# Patient Record
Sex: Female | Born: 1975 | Race: White | Hispanic: No | Marital: Married | Smoking: Never smoker
Health system: Southern US, Community
[De-identification: ages and names within clinical notes are randomized; demographics above are authoritative.]

## PROBLEM LIST (undated history)

## (undated) DIAGNOSIS — O09529 Supervision of elderly multigravida, unspecified trimester: Secondary | ICD-10-CM

## (undated) DIAGNOSIS — H9193 Unspecified hearing loss, bilateral: Secondary | ICD-10-CM

## (undated) DIAGNOSIS — F419 Anxiety disorder, unspecified: Secondary | ICD-10-CM

## (undated) DIAGNOSIS — N200 Calculus of kidney: Secondary | ICD-10-CM

## (undated) DIAGNOSIS — F329 Major depressive disorder, single episode, unspecified: Secondary | ICD-10-CM

## (undated) DIAGNOSIS — F32A Depression, unspecified: Secondary | ICD-10-CM

## (undated) DIAGNOSIS — D649 Anemia, unspecified: Secondary | ICD-10-CM

## (undated) DIAGNOSIS — Z8619 Personal history of other infectious and parasitic diseases: Secondary | ICD-10-CM

## (undated) DIAGNOSIS — J302 Other seasonal allergic rhinitis: Secondary | ICD-10-CM

## (undated) HISTORY — PX: WISDOM TOOTH EXTRACTION: SHX21

## (undated) HISTORY — DX: Supervision of elderly multigravida, unspecified trimester: O09.529

## (undated) HISTORY — DX: Anemia, unspecified: D64.9

## (undated) HISTORY — DX: Unspecified hearing loss, bilateral: H91.93

## (undated) HISTORY — DX: Personal history of other infectious and parasitic diseases: Z86.19

---

## 2012-06-21 ENCOUNTER — Encounter (HOSPITAL_COMMUNITY): Payer: Self-pay | Admitting: Emergency Medicine

## 2012-06-21 ENCOUNTER — Emergency Department (HOSPITAL_COMMUNITY): Payer: BC Managed Care – PPO

## 2012-06-21 ENCOUNTER — Emergency Department (HOSPITAL_COMMUNITY)
Admission: EM | Admit: 2012-06-21 | Discharge: 2012-06-21 | Disposition: A | Discharge status: Home / Self Care | Payer: BC Managed Care – PPO | Attending: Emergency Medicine | Admitting: Emergency Medicine

## 2012-06-21 DIAGNOSIS — Z3202 Encounter for pregnancy test, result negative: Secondary | ICD-10-CM | POA: Insufficient documentation

## 2012-06-21 DIAGNOSIS — R112 Nausea with vomiting, unspecified: Secondary | ICD-10-CM | POA: Insufficient documentation

## 2012-06-21 DIAGNOSIS — N83209 Unspecified ovarian cyst, unspecified side: Secondary | ICD-10-CM

## 2012-06-21 LAB — URINALYSIS, ROUTINE W REFLEX MICROSCOPIC
Bilirubin Urine: NEGATIVE
Glucose, UA: NEGATIVE mg/dL
Hgb urine dipstick: NEGATIVE
Ketones, ur: 40 mg/dL — AB
Protein, ur: NEGATIVE mg/dL
pH: 6.5 (ref 5.0–8.0)

## 2012-06-21 LAB — URINE MICROSCOPIC-ADD ON

## 2012-06-21 MED ORDER — HYDROMORPHONE HCL PF 1 MG/ML IJ SOLN
1.0000 mg | Freq: Once | INTRAMUSCULAR | Status: AC
  Administered 2012-06-21: 1 mg via INTRAVENOUS
  Filled 2012-06-21: qty 1

## 2012-06-21 MED ORDER — SODIUM CHLORIDE 0.9 % IV BOLUS (SEPSIS)
1000.0000 mL | Freq: Once | INTRAVENOUS | Status: AC
  Administered 2012-06-21: 1000 mL via INTRAVENOUS

## 2012-06-21 MED ORDER — ONDANSETRON HCL 4 MG/2ML IJ SOLN
4.0000 mg | Freq: Once | INTRAMUSCULAR | Status: AC
  Administered 2012-06-21: 4 mg via INTRAVENOUS
  Filled 2012-06-21: qty 2

## 2012-06-21 MED ORDER — HYDROMORPHONE HCL PF 2 MG/ML IJ SOLN
INTRAMUSCULAR | Status: AC
  Administered 2012-06-21: 1 mg
  Filled 2012-06-21: qty 1

## 2012-06-21 MED ORDER — HYDROCODONE-ACETAMINOPHEN 5-500 MG PO TABS: 1.0000 | ORAL_TABLET | Freq: Four times a day (QID) | ORAL | Status: DC | PRN

## 2012-06-21 MED ORDER — IBUPROFEN 600 MG PO TABS: 600.0000 mg | ORAL_TABLET | Freq: Four times a day (QID) | ORAL | Status: DC | PRN

## 2012-06-21 MED ORDER — KETOROLAC TROMETHAMINE 30 MG/ML IJ SOLN
30.0000 mg | Freq: Once | INTRAMUSCULAR | Status: AC
  Administered 2012-06-21: 30 mg via INTRAVENOUS
  Filled 2012-06-21: qty 1

## 2012-06-21 MED ORDER — ONDANSETRON HCL 4 MG PO TABS: 4.0000 mg | ORAL_TABLET | Freq: Four times a day (QID) | ORAL | Status: DC

## 2012-06-21 MED ORDER — HYDROMORPHONE HCL PF 1 MG/ML IJ SOLN: 1.0000 mg | Freq: Once | INTRAMUSCULAR | Status: AC

## 2012-06-21 NOTE — ED Notes (Signed)
Pt states she is having pain in her left flank that started about 2200  Pt states has had one episode of vomiting  Pt states pain and cramping

## 2012-06-21 NOTE — ED Provider Notes (Signed)
History     CSN: [id number]  Arrival date & time 06/21/12  0315   First MD Initiated Contact with Patient 06/21/12 0404      Chief Complaint  Patient presents with  . Flank Pain    (Consider location/radiation/quality/duration/timing/severity/associated sxs/prior treatment) HPI History provided by patient. Flank pain onset 10 PM tonight, sudden onset severe pain, does not radiate from flank. Has not noticed any hematuria. Has associated nausea with vomiting x1. No diarrhea. LMP 3 weeks ago. No trauma. No fevers. No back pain. No history of same. History reviewed. No pertinent past medical history.  History reviewed. No pertinent past surgical history.  History reviewed. No pertinent family history.  History  Substance Use Topics  . Smoking status: Never Smoker   . Smokeless tobacco: Not on file  . Alcohol Use: No    OB History    Grav Para Term Preterm Abortions TAB SAB Ect Mult Living                  Review of Systems  Constitutional: Negative for fever and chills.  HENT: Negative for neck pain and neck stiffness.   Eyes: Negative for pain.  Respiratory: Negative for shortness of breath.   Cardiovascular: Negative for chest pain.  Gastrointestinal: Positive for nausea and vomiting. Negative for abdominal pain.  Genitourinary: Positive for flank pain. Negative for dysuria.  Musculoskeletal: Negative for back pain.  Skin: Negative for rash.  Neurological: Negative for headaches.  All other systems reviewed and are negative.    Allergies  Review of patient's allergies indicates no known allergies.  Home Medications  No current outpatient prescriptions on file.  BP 129/73  Pulse 68  Temp 97.6 F (36.4 C) (Oral)  Resp 20  SpO2 100%  LMP 05/28/2012  Physical Exam  Constitutional: She is oriented to person, place, and time. She appears well-developed and well-nourished.  HENT:  Head: Normocephalic and atraumatic.  Eyes: Conjunctivae normal and EOM are  normal. Pupils are equal, round, and reactive to light.  Neck: Trachea normal. Neck supple. No thyromegaly present.  Cardiovascular: Normal rate, regular rhythm, S1 normal, S2 normal and normal pulses.     No systolic murmur is present   No diastolic murmur is present  Pulses:      Radial pulses are 2+ on the right side, and 2+ on the left side.  Pulmonary/Chest: Effort normal and breath sounds normal. She has no wheezes. She has no rhonchi. She has no rales. She exhibits no tenderness.  Abdominal: Soft. Normal appearance and bowel sounds are normal. She exhibits no distension. There is no tenderness. There is no rebound, no CVA tenderness and negative Murphy's sign.       Localizes discomfort to left flank without any reproducible tenderness.  Musculoskeletal: She exhibits no edema and no tenderness.       Moves all extremities x4  Neurological: She is alert and oriented to person, place, and time. She has normal strength. No cranial nerve deficit or sensory deficit. GCS eye subscore is 4. GCS verbal subscore is 5. GCS motor subscore is 6.  Skin: Skin is warm and dry. No rash noted. She is not diaphoretic.  Psychiatric: Her speech is normal.       Cooperative and appropriate    ED Course  Procedures (including critical care time)  Results for orders placed during the hospital encounter of 06/21/12  URINALYSIS, ROUTINE W REFLEX MICROSCOPIC      Component Value Range   Color, Urine  YELLOW  YELLOW   APPearance CLOUDY (*) CLEAR   Specific Gravity, Urine 1.023  1.005 - 1.030   pH 6.5  5.0 - 8.0   Glucose, UA NEGATIVE  NEGATIVE mg/dL   Hgb urine dipstick NEGATIVE  NEGATIVE   Bilirubin Urine NEGATIVE  NEGATIVE   Ketones, ur 40 (*) NEGATIVE mg/dL   Protein, ur NEGATIVE  NEGATIVE mg/dL   Urobilinogen, UA 1.0  0.0 - 1.0 mg/dL   Nitrite NEGATIVE  NEGATIVE   Leukocytes, UA MODERATE (*) NEGATIVE  PREGNANCY, URINE      Component Value Range   Preg Test, Ur NEGATIVE  NEGATIVE  URINE  MICROSCOPIC-ADD ON      Component Value Range   Squamous Epithelial / LPF RARE  RARE   WBC, UA 3-6  <3 WBC/hpf   Bacteria, UA FEW (*) RARE   Urine-Other MUCOUS PRESENT     Ct Abdomen Pelvis Wo Contrast  06/21/2012  *RADIOLOGY REPORT*  Clinical Data: Left flank pain, nausea and vomiting.  White blood cells in urine.  CT ABDOMEN AND PELVIS WITHOUT CONTRAST  Technique:  Multidetector CT imaging of the abdomen and pelvis was performed following the standard protocol without intravenous contrast.  Comparison: None.  Findings: Minimal bibasilar atelectasis is noted.  The liver and spleen are unremarkable in appearance.  The gallbladder is within normal limits.  The pancreas and adrenal glands are unremarkable.  Scattered tiny nonobstructing bilateral renal stones are seen, measuring up to 3 mm in size.  The kidneys are otherwise unremarkable in appearance.  There is no evidence of hydronephrosis.  No obstructing renal stones are identified.  No perinephric stranding is seen.  No free fluid is identified.  The small bowel is unremarkable in appearance.  The stomach is within normal limits.  No acute vascular abnormalities are seen.  The appendix is normal in caliber, without evidence for appendicitis.  The colon is largely decompressed and is unremarkable in appearance.  The bladder is mildly distended and grossly unremarkable in appearance.  The uterus is within normal limits.  A 5.4 cm cystic lesion is noted at the left adnexa.  The ovaries are otherwise unremarkable in appearance.  No inguinal lymphadenopathy is seen.  No acute osseous abnormalities are identified.  IMPRESSION:  1.  5.4 cm left adnexal cystic lesion noted; this could be further assessed on pelvic ultrasound, when and as deemed clinically appropriate. 2.  Scattered tiny nonobstructing bilateral renal stones seen, measuring up to 3 mm in size; no evidence of hydronephrosis.   Original Report Authenticated By: Tonia Ghent, M.D.     IV fluids.  IV Dilaudid. IV Zofran.  UA reviewed no hematuria or UTI. MDM   Left flank pain. Treated with IV narcotics. Requiring 3 doses of IV Dilaudid with intermittent pain control. CT scan obtained and reviewed, has a large ovarian cyst. Given persistent pain, ultrasound ordered to further evaluate for flow/ possible torsion.        Sunnie Nielsen, MD 06/21/12 [phone]

## 2012-06-21 NOTE — ED Provider Notes (Signed)
Report received at end of shift from Dr. Dierdre Highman.  Pt with L flank pain, treated with IV dilaudid with intermitted pain control.  CT shows large ovarian cyst.  [place] ordered to evaluate flow and r/o torsion.  Will consult OB/GYN once [place] is resulted.    BP 129/73  Pulse 68  Temp 97.6 F (36.4 C) (Oral)  Resp 20  SpO2 100%  LMP 05/28/2012  On examination she appeared in good health and spirits. Vital signs as documented. Skin warm and dry and without overt rashes. Neck without JVD. Lungs clear. Heart exam notable for regular rhythm, normal sounds and absence of murmurs, rubs or gallops. Abdomen mildly tender to LLQ without guarding or rebound tenderness and without evidence of organomegaly, masses, or abdominal aortic enlargement. No CVA tenderness.  Extremities nonedematous.  8:51 AM Pelvic and transvaginal US shows no evidence of abscess or ovarian torsion.  There is a 5x6 cm simple left adnexal cyst.  It could be a paraovarian cyst or an exophytic cyst.  Therefore, i recommend pt to f/u with GYN for close f/u as she may need f/u US following her next menstrual period.  Exophytic cyst can increase risk of ovarian torsion.  Pt is aware of plan and will f/u closely.  Resources given.  Pt able to tolerates PO, pain has improved especially after receiving toradol.    I have reviewed nursing notes and vital signs. I personally reviewed the imaging tests through PACS system  I reviewed available ER/hospitalization records thought the EMR  Results for orders placed during the hospital encounter of 06/21/12  URINALYSIS, ROUTINE W REFLEX MICROSCOPIC      Component Value Range   Color, Urine YELLOW  YELLOW   APPearance CLOUDY (*) CLEAR   Specific Gravity, Urine 1.023  1.005 - 1.030   pH 6.5  5.0 - 8.0   Glucose, UA NEGATIVE  NEGATIVE mg/dL   Hgb urine dipstick NEGATIVE  NEGATIVE   Bilirubin Urine NEGATIVE  NEGATIVE   Ketones, ur 40 (*) NEGATIVE mg/dL   Protein, ur NEGATIVE  NEGATIVE mg/dL    Urobilinogen, UA 1.0  0.0 - 1.0 mg/dL   Nitrite NEGATIVE  NEGATIVE   Leukocytes, UA MODERATE (*) NEGATIVE  PREGNANCY, URINE      Component Value Range   Preg Test, Ur NEGATIVE  NEGATIVE  URINE MICROSCOPIC-ADD ON      Component Value Range   Squamous Epithelial / LPF RARE  RARE   WBC, UA 3-6  <3 WBC/hpf   Bacteria, UA FEW (*) RARE   Urine-Other MUCOUS PRESENT     Ct Abdomen Pelvis Wo Contrast  06/21/2012  *RADIOLOGY REPORT*  Clinical Data: Left flank pain, nausea and vomiting.  White blood cells in urine.  CT ABDOMEN AND PELVIS WITHOUT CONTRAST  Technique:  Multidetector CT imaging of the abdomen and pelvis was performed following the standard protocol without intravenous contrast.  Comparison: None.  Findings: Minimal bibasilar atelectasis is noted.  The liver and spleen are unremarkable in appearance.  The gallbladder is within normal limits.  The pancreas and adrenal glands are unremarkable.  Scattered tiny nonobstructing bilateral renal stones are seen, measuring up to 3 mm in size.  The kidneys are otherwise unremarkable in appearance.  There is no evidence of hydronephrosis.  No obstructing renal stones are identified.  No perinephric stranding is seen.  No free fluid is identified.  The small bowel is unremarkable in appearance.  The stomach is within normal limits.  No acute vascular abnormalities  are seen.  The appendix is normal in caliber, without evidence for appendicitis.  The colon is largely decompressed and is unremarkable in appearance.  The bladder is mildly distended and grossly unremarkable in appearance.  The uterus is within normal limits.  A 5.4 cm cystic lesion is noted at the left adnexa.  The ovaries are otherwise unremarkable in appearance.  No inguinal lymphadenopathy is seen.  No acute osseous abnormalities are identified.  IMPRESSION:  1.  5.4 cm left adnexal cystic lesion noted; this could be further assessed on pelvic ultrasound, when and as deemed clinically appropriate.  2.  Scattered tiny nonobstructing bilateral renal stones seen, measuring up to 3 mm in size; no evidence of hydronephrosis.   Original Report Authenticated By: Tonia Ghent, M.D.    US Transvaginal Non-ob  06/21/2012  *RADIOLOGY REPORT*  Clinical Data:  Severe left-sided pain.  Evaluate ovary.  5 cm left adnexal cystic lesion seen on CT abdomen pelvis performed earlier today.  TRANSABDOMINAL AND TRANSVAGINAL ULTRASOUND OF PELVIS DOPPLER ULTRASOUND OF OVARIES  Technique:  Both transabdominal and transvaginal ultrasound examinations of the pelvis were performed. Transabdominal technique was performed for global imaging of the pelvis including uterus, ovaries, adnexal regions, and pelvic cul-de-sac.  It was necessary to proceed with endovaginal exam following the transabdominal exam to visualize the ovaries and adnexa.  Color and duplex Doppler ultrasound was utilized to evaluate blood flow to the ovaries.  Comparison:  CT abdomen pelvis 06/21/2012  Findings:  Uterus:  Normal in size and appearance.  Measures 9.6 x 4.6 x 6.6 cm and is anteverted.  No focal uterine mass identified.  Endometrium:  Measures 12 mm in thickness and is homogeneous.  Right ovary: Normal appearance.  No adnexal mass.  Measures 3.4 x 2.1 x 2.0 cm and contains multiple small follicles.  No cyst or mass identified.  Color Doppler flow is identified to the right ovary.  Left ovary:   A normal appearing left ovary is seen measuring 2.8 x 2.2 x 2.3 cm that contains small follicles and demonstrates normal vascular flow.  Immediately adjacent to the left ovary is a simple cyst measuring 5.4 x 4.1 x 5.9 cm.  Color Doppler flow is seen focally along the periphery of this cyst.  It is favored to be an exophytic left ovarian cyst.  A paraovarian cyst is also considered.  Pulsed Doppler evaluation demonstrates normal low-resistance arterial and venous waveforms in both ovaries.  A trace amount of free pelvic fluid is seen.  IMPRESSION: 1.  5.4 x 4.1 x  5.9 cm simple left adnexal cyst.  This is either an exophytic cyst extending from the adjacent left ovary or a paraovarian cyst. If this cyst is not surgically removed, short- term follow-up ultrasound following the patient's next menstrual period is suggested. If this is an exophytic cyst, it can place the patient at increased risk for ovarian torsion, although no signs of ovarian torsion are seen at this time.  2.  No sonographic evidence of ovarian torsion.   Original Report Authenticated By: Britta Mccreedy, M.D.    US Pelvis Complete  06/21/2012  *RADIOLOGY REPORT*  Clinical Data:  Severe left-sided pain.  Evaluate ovary.  5 cm left adnexal cystic lesion seen on CT abdomen pelvis performed earlier today.  TRANSABDOMINAL AND TRANSVAGINAL ULTRASOUND OF PELVIS DOPPLER ULTRASOUND OF OVARIES  Technique:  Both transabdominal and transvaginal ultrasound examinations of the pelvis were performed. Transabdominal technique was performed for global imaging of the pelvis including uterus, ovaries, adnexal  regions, and pelvic cul-de-sac.  It was necessary to proceed with endovaginal exam following the transabdominal exam to visualize the ovaries and adnexa.  Color and duplex Doppler ultrasound was utilized to evaluate blood flow to the ovaries.  Comparison:  CT abdomen pelvis 06/21/2012  Findings:  Uterus:  Normal in size and appearance.  Measures 9.6 x 4.6 x 6.6 cm and is anteverted.  No focal uterine mass identified.  Endometrium:  Measures 12 mm in thickness and is homogeneous.  Right ovary: Normal appearance.  No adnexal mass.  Measures 3.4 x 2.1 x 2.0 cm and contains multiple small follicles.  No cyst or mass identified.  Color Doppler flow is identified to the right ovary.  Left ovary:   A normal appearing left ovary is seen measuring 2.8 x 2.2 x 2.3 cm that contains small follicles and demonstrates normal vascular flow.  Immediately adjacent to the left ovary is a simple cyst measuring 5.4 x 4.1 x 5.9 cm.  Color  Doppler flow is seen focally along the periphery of this cyst.  It is favored to be an exophytic left ovarian cyst.  A paraovarian cyst is also considered.  Pulsed Doppler evaluation demonstrates normal low-resistance arterial and venous waveforms in both ovaries.  A trace amount of free pelvic fluid is seen.  IMPRESSION: 1.  5.4 x 4.1 x 5.9 cm simple left adnexal cyst.  This is either an exophytic cyst extending from the adjacent left ovary or a paraovarian cyst. If this cyst is not surgically removed, short- term follow-up ultrasound following the patient's next menstrual period is suggested. If this is an exophytic cyst, it can place the patient at increased risk for ovarian torsion, although no signs of ovarian torsion are seen at this time.  2.  No sonographic evidence of ovarian torsion.   Original Report Authenticated By: Britta Mccreedy, M.D.    [place] Art/ven Flow Abd Pelv Doppler  06/21/2012  *RADIOLOGY REPORT*  Clinical Data:  Severe left-sided pain.  Evaluate ovary.  5 cm left adnexal cystic lesion seen on CT abdomen pelvis performed earlier today.  TRANSABDOMINAL AND TRANSVAGINAL ULTRASOUND OF PELVIS DOPPLER ULTRASOUND OF OVARIES  Technique:  Both transabdominal and transvaginal ultrasound examinations of the pelvis were performed. Transabdominal technique was performed for global imaging of the pelvis including uterus, ovaries, adnexal regions, and pelvic cul-de-sac.  It was necessary to proceed with endovaginal exam following the transabdominal exam to visualize the ovaries and adnexa.  Color and duplex Doppler ultrasound was utilized to evaluate blood flow to the ovaries.  Comparison:  CT abdomen pelvis 06/21/2012  Findings:  Uterus:  Normal in size and appearance.  Measures 9.6 x 4.6 x 6.6 cm and is anteverted.  No focal uterine mass identified.  Endometrium:  Measures 12 mm in thickness and is homogeneous.  Right ovary: Normal appearance.  No adnexal mass.  Measures 3.4 x 2.1 x 2.0 cm and contains  multiple small follicles.  No cyst or mass identified.  Color Doppler flow is identified to the right ovary.  Left ovary:   A normal appearing left ovary is seen measuring 2.8 x 2.2 x 2.3 cm that contains small follicles and demonstrates normal vascular flow.  Immediately adjacent to the left ovary is a simple cyst measuring 5.4 x 4.1 x 5.9 cm.  Color Doppler flow is seen focally along the periphery of this cyst.  It is favored to be an exophytic left ovarian cyst.  A paraovarian cyst is also considered.  Pulsed Doppler evaluation  demonstrates normal low-resistance arterial and venous waveforms in both ovaries.  A trace amount of free pelvic fluid is seen.  IMPRESSION: 1.  5.4 x 4.1 x 5.9 cm simple left adnexal cyst.  This is either an exophytic cyst extending from the adjacent left ovary or a paraovarian cyst. If this cyst is not surgically removed, short- term follow-up ultrasound following the patient's next menstrual period is suggested. If this is an exophytic cyst, it can place the patient at increased risk for ovarian torsion, although no signs of ovarian torsion are seen at this time.  2.  No sonographic evidence of ovarian torsion.   Original Report Authenticated By: Britta Mccreedy, M.D.        Fayrene Helper, PA-C 06/21/12 0857  Fayrene Helper, PA-C 06/21/12 1610

## 2012-06-21 NOTE — ED Notes (Signed)
Patient transported to Ultrasound 

## 2012-06-22 ENCOUNTER — Telehealth: Payer: Self-pay | Admitting: *Deleted

## 2012-06-22 LAB — URINE CULTURE: Colony Count: 85000

## 2012-06-22 NOTE — ED Provider Notes (Signed)
Medical screening examination/treatment/procedure(s) were conducted as a shared visit with non-physician practitioner(s) and myself.  I personally evaluated the patient during the encounter  Sunnie Nielsen, MD 06/22/12 [phone]

## 2012-06-22 NOTE — Telephone Encounter (Signed)
Pt left message stating that she is a new pt and has appt later this month. She has some questions. Please call back.

## 2012-06-22 NOTE — Telephone Encounter (Signed)
Spoke with patient and she has c/o pelvic pain. She wants to know if she should/could come sooner than 12/19 appt. Advised patient that we don't have any sooner appointments available. If her pain worsens with no relief from pain med prescribed and with new symptoms such as fever or vaginal d/c--she is to come to [facility] otherwise we will expect her next week for her f/u appt. Patient agrees and satisfied.

## 2012-07-06 ENCOUNTER — Encounter: Payer: Self-pay | Admitting: Obstetrics & Gynecology

## 2012-07-06 ENCOUNTER — Ambulatory Visit (INDEPENDENT_AMBULATORY_CARE_PROVIDER_SITE_OTHER): Payer: BC Managed Care – PPO | Admitting: Obstetrics & Gynecology

## 2012-07-06 VITALS — BP 111/75 | HR 89 | Temp 97.4°F | Ht 62.0 in | Wt 160.5 lb

## 2012-07-06 DIAGNOSIS — N83209 Unspecified ovarian cyst, unspecified side: Secondary | ICD-10-CM

## 2012-07-06 DIAGNOSIS — Z23 Encounter for immunization: Secondary | ICD-10-CM

## 2012-07-06 MED ORDER — INFLUENZA VIRUS VACC SPLIT PF IM SUSP
0.5000 mL | Freq: Once | INTRAMUSCULAR | Status: AC
  Administered 2012-07-06: 0.5 mL via INTRAMUSCULAR

## 2012-07-06 NOTE — Patient Instructions (Addendum)
Pelvic Pain Pelvic pain is pain below the belly button and located between your hips. Acute pain may last a few hours or days. Chronic pelvic pain may last weeks and months. The cause may be different for different types of pain. The pain may be dull or sharp, mild or severe and can interfere with your daily activities. Write down and tell your caregiver:   Exactly where the pain is located.  If it comes and goes or is there all the time.  When it happens (with sex, urination, bowel movement, etc.)  If the pain is related to your menstrual period or stress. Your caregiver will take a full history and do a complete physical exam and Pap test. CAUSES   Painful menstrual periods (dysmenorrhea).  Normal ovulation (Mittelschmertz) that occurs in the middle of the menstrual cycle every month.  The pelvic organs get engorged with blood just before the menstrual period (pelvic congestive syndrome).  Scar tissue from an infection or past surgery (pelvic adhesions).  Cancer of the female pelvic organs. When there is pain with cancer, it has been there for a long time.  The lining of the uterus (endometrium) abnormally grows in places like the pelvis and on the pelvic organs (endometriosis).  A form of endometriosis with the lining of the uterus present inside of the muscle tissue of the uterus (adenomyosis).  Fibroid tumor (noncancerous) in the uterus.  Bladder problems such as infection, bladder spasms of the muscle tissue of the bladder.  Intestinal problems (irritable bowel syndrome, colitis, an ulcer or gastrointestinal infection).  Polyps of the cervix or uterus.  Pregnancy in the tube (ectopic pregnancy).  The opening of the cervix is too small for the menstrual blood to flow through it (cervical stenosis).  Physical or sexual abuse (past or present).  Musculo-skeletal problems from poor posture, problems with the vertebrae of the lower back or the uterine pelvic muscles falling  (prolapse).  Psychological problems such as depression or stress.  IUD (intrauterine device) in the uterus. DIAGNOSIS  Tests to make a diagnosis depends on the type, location, severity and what causes the pain to occur. Tests that may be needed include:  Blood tests.  Urine tests  Ultrasound.  X-rays.  CT Scan.  MRI.  Laparoscopy.  Major surgery. TREATMENT  Treatment will depend on the cause of the pain, which includes:  Prescription or over-the-counter pain medication.  Antibiotics.  Birth control pills.  Hormone treatment.  Nerve blocking injections.  Physical therapy.  Antidepressants.  Counseling with a psychiatrist or psychologist.  Minor or major surgery. HOME CARE INSTRUCTIONS   Only take over-the-counter or prescription medicines for pain, discomfort or fever as directed by your caregiver.  Follow your caregiver's advice to treat your pain.  Rest.  Avoid sexual intercourse if it causes the pain.  Apply warm or cold compresses (which ever works best) to the pain area.  Do relaxation exercises such as yoga or meditation.  Try acupuncture.  Avoid stressful situations.  Try group therapy.  If the pain is because of a stomach/intestinal upset, drink clear liquids, eat a bland light food diet until the symptoms go away. SEEK MEDICAL CARE IF:   You need stronger prescription pain medication.  You develop pain with sexual intercourse.  You have pain with urination.  You develop a temperature of 102 F (38.9 C) with the pain.  You are still in pain after 4 hours of taking prescription medication for the pain.  You need depression medication.    Your IUD is causing pain and you want it removed. SEEK IMMEDIATE MEDICAL CARE IF:  You develop very severe pain or tenderness.  You faint, have chills, severe weakness or dehydration.  You develop heavy vaginal bleeding or passing solid tissue.  You develop a temperature of 102 F (38.9 C)  with the pain.  You have blood in the urine.  You are being physically or sexually abused.  You have uncontrolled vomiting and diarrhea.  You are depressed and afraid of harming yourself or someone else. Document Released: 08/12/2004 Document Revised: 09/27/2011 Document Reviewed: 05/09/2008 [facility] Patient Information 2013 [place], [place]. Ovarian Cyst The ovaries are small organs that are on each side of the uterus. The ovaries are the organs that produce the female hormones, estrogen and progesterone. An ovarian cyst is a sac filled with fluid that can vary in its size. It is normal for a small cyst to form in women who are in the childbearing age and who have menstrual periods. This type of cyst is called a follicle cyst that becomes an ovulation cyst (corpus luteum cyst) after it produces the women's egg. It later goes away on its own if the woman does not become pregnant. There are other kinds of ovarian cysts that may cause problems and may need to be treated. The most serious problem is a cyst with cancer. It should be noted that menopausal women who have an ovarian cyst are at a higher risk of it being a cancer cyst. They should be evaluated very quickly, thoroughly and followed closely. This is especially true in menopausal women because of the high rate of ovarian cancer in women in menopause. CAUSES AND TYPES OF OVARIAN CYSTS:  FUNCTIONAL CYST: The follicle/corpus luteum cyst is a functional cyst that occurs every month during ovulation with the menstrual cycle. They go away with the next menstrual cycle if the woman does not get pregnant. Usually, there are no symptoms with a functional cyst.  ENDOMETRIOMA CYST: This cyst develops from the lining of the uterus tissue. This cyst gets in or on the ovary. It grows every month from the bleeding during the menstrual period. It is also called a "chocolate cyst" because it becomes filled with blood that turns brown. This cyst can cause pain  in the lower abdomen during intercourse and with your menstrual period.  CYSTADENOMA CYST: This cyst develops from the cells on the outside of the ovary. They usually are not cancerous. They can get very big and cause lower abdomen pain and pain with intercourse. This type of cyst can twist on itself, cut off its blood supply and cause severe pain. It also can easily rupture and cause a lot of pain.  DERMOID CYST: This type of cyst is sometimes found in both ovaries. They are found to have different kinds of body tissue in the cyst. The tissue includes skin, teeth, hair, and/or cartilage. They usually do not have symptoms unless they get very big. Dermoid cysts are rarely cancerous.  POLYCYSTIC OVARY: This is a rare condition with hormone problems that produces many small cysts on both ovaries. The cysts are follicle-like cysts that never produce an egg and become a corpus luteum. It can cause an increase in body weight, infertility, acne, increase in body and facial hair and lack of menstrual periods or rare menstrual periods. Many women with this problem develop type 2 diabetes. The exact cause of this problem is unknown. A polycystic ovary is rarely cancerous.  THECA LUTEIN CYST: Occurs  when too much hormone (human chorionic gonadotropin) is produced and over-stimulates the ovaries to produce an egg. They are frequently seen when doctors stimulate the ovaries for invitro-fertilization (test tube babies).  LUTEOMA CYST: This cyst is seen during pregnancy. Rarely it can cause an obstruction to the birth canal during labor and delivery. They usually go away after delivery. SYMPTOMS   Pelvic pain or pressure.  Pain during sexual intercourse.  Increasing girth (swelling) of the abdomen.  Abnormal menstrual periods.  Increasing pain with menstrual periods.  You stop having menstrual periods and you are not pregnant. DIAGNOSIS  The diagnosis can be made during:  Routine or annual pelvic  examination (common).  Ultrasound.  X-ray of the pelvis.  CT Scan.  MRI.  Blood tests. TREATMENT   Treatment may only be to follow the cyst monthly for 2 to 3 months with your caregiver. Many go away on their own, especially functional cysts.  May be aspirated (drained) with a long needle with ultrasound, or by laparoscopy (inserting a tube into the pelvis through a small incision).  The whole cyst can be removed by laparoscopy.  Sometimes the cyst may need to be removed through an incision in the lower abdomen.  Hormone treatment is sometimes used to help dissolve certain cysts.  Birth control pills are sometimes used to help dissolve certain cysts. HOME CARE INSTRUCTIONS  Follow your caregiver's advice regarding:  Medicine.  Follow up visits to evaluate and treat the cyst.  You may need to come back or make an appointment with another caregiver, to find the exact cause of your cyst, if your caregiver is not a gynecologist.  Get your yearly and recommended pelvic examinations and Pap tests.  Let your caregiver know if you have had an ovarian cyst in the past. SEEK MEDICAL CARE IF:   Your periods are late, irregular, they stop, or are painful.  Your stomach (abdomen) or pelvic pain does not go away.  Your stomach becomes larger or swollen.  You have pressure on your bladder or trouble emptying your bladder completely.  You have painful sexual intercourse.  You have feelings of fullness, pressure, or discomfort in your stomach.  You lose weight for no apparent reason.  You feel generally ill.  You become constipated.  You lose your appetite.  You develop acne.  You have an increase in body and facial hair.  You are gaining weight, without changing your exercise and eating habits.  You think you are pregnant. SEEK IMMEDIATE MEDICAL CARE IF:   You have increasing abdominal pain.  You feel sick to your stomach (nausea) and/or vomit.  You develop a  fever that comes on suddenly.  You develop abdominal pain during a bowel movement.  Your menstrual periods become heavier than usual. Document Released: 07/05/2005 Document Revised: 09/27/2011 Document Reviewed: 05/08/2009 [facility] Patient Information 2013 [place], [place].

## 2012-07-06 NOTE — Progress Notes (Addendum)
Subjective:     Patient ID: Karen Monroe, female   DOB: 04-17-1976, 36 y.o.   MRN: [id number]  HPI Karen Monroe is a 36 y.o. female who presents today for follow-up from the ED where Karen Monroe was diagnosed with an ovarian cyst. The patient went to the ED 2 weeks ago for left flank pain. At that time the pain had been so severe that Karen Monroe had vomited. The pain has improved significantly. Karen Monroe rates her pain today at 1-2/10 and describes it as more of a fullness than pain. Karen Monroe has also been experiencing low back pain, bloating and constipation over the last week. Karen Monroe called the ED about her constipation and was told that it could be a result of the Vicodin that they gave her for pain. Karen Monroe was told to start colace to help with her symptoms and has felt some improvement.   The patient also would like advice about her hemorrhoids. Karen Monroe has had issues with hemorrhoids in the past especially following the birth of her son. Karen Monroe has had injections for internal hemorrhoids in the past that helped a lot. Karen Monroe has had bleeding associated with her bowel movements this past week and would like to know when further evaluation would be recommended. Karen Monroe moved here from [place] within the last year or two and has not yet established care with a PCP.   Review of Systems All negative unless otherwise stated in HPI    Objective:   Physical Exam BP 111/75  Pulse 89  Temp 97.4 F (36.3 C) (Oral)  Ht 5\' 2"  (1.575 m)  Wt 160 lb 8 oz (72.802 kg)  BMI 29.36 kg/m2  LMP 05/28/2012 GENERAL: Well-developed, well-nourished female in no acute distress.  HEENT: Normocephalic, atraumatic. Sclerae anicteric.  LUNGS: Normal effort.  HEART: Regular rate. ABDOMEN: Soft, mild tenderness in the LLQ, nondistended. No organomegaly. Bowel sounds present in all quadrants.  BIMANUAL: Normal external female genitalia.Uterus is normal in size. No adnexal mass, mild tenderness of the left adnexa.  EXTREMITIES: No cyanosis, clubbing, or  edema     Assessment:     1. Ovarian cyst     Plan:     Flu shot given today Follow-up US scheduled in 3 months. Patient will then follow-up in clinic to discuss results Contact information given for [facility]. Patient will call to establish care with both offices Recommended Miralax daily for improved digestive regularity Patient encouraged to call office with any questions or concerns prior to her scheduled follow-up     Attestation of Attending Supervision of Advanced Practitioner (CNM/NP): Evaluation and management procedures were performed by the Advanced Practitioner under my supervision and collaboration.  I have reviewed the Advanced Practitioner's note and chart, and I agree with the management and plan.  HARRAWAY-SMITH, CAROLYN 9:36 AM

## 2012-07-14 ENCOUNTER — Other Ambulatory Visit (HOSPITAL_COMMUNITY): Payer: BC Managed Care – PPO

## 2012-07-19 NOTE — L&D Delivery Note (Signed)
Delivery Note At 4:36 PM a viable female was delivered via  (Presentation DOA: ;  ).  APGAR per nursing notes: , ; weight .  pending Placenta status: , .  Cord:  with the following complications: .  Cord pH: not indicated  Anesthesia: Epidural  Episiotomy: none Lacerations: 2nd degree perineal Suture Repair: 3.0 vicryl rapide Est. Blood Loss (mL): 350cc  Mom to postpartum.  Baby to nursery-stable.  [name] 02/26/2013, 4:47 PM

## 2012-08-09 LAB — OB RESULTS CONSOLE HEPATITIS B SURFACE ANTIGEN: Hepatitis B Surface Ag: NEGATIVE

## 2012-08-09 LAB — OB RESULTS CONSOLE GC/CHLAMYDIA: Chlamydia: NEGATIVE

## 2012-08-09 LAB — OB RESULTS CONSOLE HIV ANTIBODY (ROUTINE TESTING): HIV: NONREACTIVE

## 2012-08-09 LAB — OB RESULTS CONSOLE RPR: RPR: NONREACTIVE

## 2012-10-04 ENCOUNTER — Ambulatory Visit (HOSPITAL_COMMUNITY): Payer: BC Managed Care – PPO

## 2013-02-23 ENCOUNTER — Encounter (HOSPITAL_COMMUNITY): Payer: Self-pay | Admitting: *Deleted

## 2013-02-23 ENCOUNTER — Telehealth (HOSPITAL_COMMUNITY): Payer: Self-pay | Admitting: *Deleted

## 2013-02-23 ENCOUNTER — Other Ambulatory Visit: Payer: Self-pay | Admitting: Obstetrics

## 2013-02-23 NOTE — Telephone Encounter (Signed)
Preadmission screen  

## 2013-02-26 ENCOUNTER — Inpatient Hospital Stay (HOSPITAL_COMMUNITY): Payer: BC Managed Care – PPO | Admitting: Anesthesiology

## 2013-02-26 ENCOUNTER — Encounter (HOSPITAL_COMMUNITY): Payer: Self-pay

## 2013-02-26 ENCOUNTER — Encounter (HOSPITAL_COMMUNITY): Payer: Self-pay | Admitting: Anesthesiology

## 2013-02-26 ENCOUNTER — Inpatient Hospital Stay (HOSPITAL_COMMUNITY)
Admission: RE | Admit: 2013-02-26 | Discharge: 2013-02-28 | DRG: 373 | Disposition: A | Discharge status: Home / Self Care | Payer: BC Managed Care – PPO | Source: Ambulatory Visit | Attending: Obstetrics | Admitting: Obstetrics

## 2013-02-26 DIAGNOSIS — O9903 Anemia complicating the puerperium: Secondary | ICD-10-CM | POA: Diagnosis not present

## 2013-02-26 DIAGNOSIS — O34599 Maternal care for other abnormalities of gravid uterus, unspecified trimester: Secondary | ICD-10-CM | POA: Diagnosis not present

## 2013-02-26 DIAGNOSIS — O09529 Supervision of elderly multigravida, unspecified trimester: Secondary | ICD-10-CM | POA: Diagnosis present

## 2013-02-26 DIAGNOSIS — N83209 Unspecified ovarian cyst, unspecified side: Secondary | ICD-10-CM | POA: Diagnosis present

## 2013-02-26 DIAGNOSIS — D62 Acute posthemorrhagic anemia: Secondary | ICD-10-CM | POA: Diagnosis not present

## 2013-02-26 LAB — CBC
HCT: 30.2 % — ABNORMAL LOW (ref 36.0–46.0)
Hemoglobin: 10.5 g/dL — ABNORMAL LOW (ref 12.0–15.0)
MCHC: 34.8 g/dL (ref 30.0–36.0)
MCV: 87.3 fL (ref 78.0–100.0)
RDW: 13.8 % (ref 11.5–15.5)

## 2013-02-26 MED ORDER — FENTANYL 2.5 MCG/ML BUPIVACAINE 1/10 % EPIDURAL INFUSION (WH - ANES)
14.0000 mL/h | INTRAMUSCULAR | Status: DC | PRN
  Administered 2013-02-26 (×2): 14 mL/h via EPIDURAL
  Filled 2013-02-26: qty 125

## 2013-02-26 MED ORDER — DIPHENHYDRAMINE HCL 50 MG/ML IJ SOLN: 12.5000 mg | INTRAMUSCULAR | Status: DC | PRN

## 2013-02-26 MED ORDER — LANOLIN HYDROUS EX OINT: TOPICAL_OINTMENT | CUTANEOUS | Status: DC | PRN

## 2013-02-26 MED ORDER — LACTATED RINGERS IV SOLN: 500.0000 mL | INTRAVENOUS | Status: DC | PRN

## 2013-02-26 MED ORDER — BENZOCAINE-MENTHOL 20-0.5 % EX AERO
1.0000 "application " | INHALATION_SPRAY | CUTANEOUS | Status: DC | PRN
  Filled 2013-02-26: qty 56

## 2013-02-26 MED ORDER — EPHEDRINE 5 MG/ML INJ
10.0000 mg | INTRAVENOUS | Status: DC | PRN
  Filled 2013-02-26: qty 2

## 2013-02-26 MED ORDER — FLEET ENEMA 7-19 GM/118ML RE ENEM: 1.0000 | ENEMA | Freq: Every day | RECTAL | Status: DC | PRN

## 2013-02-26 MED ORDER — DIBUCAINE 1 % RE OINT: 1.0000 "application " | TOPICAL_OINTMENT | RECTAL | Status: DC | PRN

## 2013-02-26 MED ORDER — IBUPROFEN 600 MG PO TABS
600.0000 mg | ORAL_TABLET | Freq: Four times a day (QID) | ORAL | Status: DC
  Administered 2013-02-26 – 2013-02-28 (×7): 600 mg via ORAL
  Filled 2013-02-26 (×7): qty 1

## 2013-02-26 MED ORDER — TETANUS-DIPHTH-ACELL PERTUSSIS 5-2.5-18.5 LF-MCG/0.5 IM SUSP: 0.5000 mL | Freq: Once | INTRAMUSCULAR | Status: DC

## 2013-02-26 MED ORDER — BISACODYL 10 MG RE SUPP: 10.0000 mg | Freq: Every day | RECTAL | Status: DC | PRN

## 2013-02-26 MED ORDER — PHENYLEPHRINE 40 MCG/ML (10ML) SYRINGE FOR IV PUSH (FOR BLOOD PRESSURE SUPPORT)
80.0000 ug | PREFILLED_SYRINGE | INTRAVENOUS | Status: DC | PRN
  Filled 2013-02-26: qty 2

## 2013-02-26 MED ORDER — ONDANSETRON HCL 4 MG/2ML IJ SOLN: 4.0000 mg | INTRAMUSCULAR | Status: DC | PRN

## 2013-02-26 MED ORDER — LIDOCAINE HCL (PF) 1 % IJ SOLN
30.0000 mL | INTRAMUSCULAR | Status: DC | PRN
  Filled 2013-02-26 (×2): qty 30

## 2013-02-26 MED ORDER — LIDOCAINE HCL (PF) 1 % IJ SOLN
INTRAMUSCULAR | Status: DC | PRN
  Administered 2013-02-26 (×4): 4 mL

## 2013-02-26 MED ORDER — SIMETHICONE 80 MG PO CHEW: 80.0000 mg | CHEWABLE_TABLET | ORAL | Status: DC | PRN

## 2013-02-26 MED ORDER — BUPIVACAINE HCL (PF) 0.25 % IJ SOLN
INTRAMUSCULAR | Status: DC | PRN
  Administered 2013-02-26 (×2): 5 mL via EPIDURAL

## 2013-02-26 MED ORDER — ONDANSETRON HCL 4 MG PO TABS: 4.0000 mg | ORAL_TABLET | ORAL | Status: DC | PRN

## 2013-02-26 MED ORDER — LACTATED RINGERS IV SOLN
INTRAVENOUS | Status: DC
  Administered 2013-02-26 (×2): via INTRAVENOUS

## 2013-02-26 MED ORDER — OXYTOCIN BOLUS FROM INFUSION
500.0000 mL | INTRAVENOUS | Status: DC
  Administered 2013-02-26: 500 mL via INTRAVENOUS

## 2013-02-26 MED ORDER — FLEET ENEMA 7-19 GM/118ML RE ENEM: 1.0000 | ENEMA | RECTAL | Status: DC | PRN

## 2013-02-26 MED ORDER — WITCH HAZEL-GLYCERIN EX PADS: 1.0000 "application " | MEDICATED_PAD | CUTANEOUS | Status: DC | PRN

## 2013-02-26 MED ORDER — EPHEDRINE 5 MG/ML INJ
10.0000 mg | INTRAVENOUS | Status: DC | PRN
  Filled 2013-02-26: qty 4
  Filled 2013-02-26: qty 2

## 2013-02-26 MED ORDER — PRENATAL MULTIVITAMIN CH
1.0000 | ORAL_TABLET | Freq: Every day | ORAL | Status: DC
  Administered 2013-02-27: 1 via ORAL
  Filled 2013-02-26: qty 1

## 2013-02-26 MED ORDER — ONDANSETRON HCL 4 MG/2ML IJ SOLN: 4.0000 mg | Freq: Four times a day (QID) | INTRAMUSCULAR | Status: DC | PRN

## 2013-02-26 MED ORDER — DIPHENHYDRAMINE HCL 25 MG PO CAPS: 25.0000 mg | ORAL_CAPSULE | Freq: Four times a day (QID) | ORAL | Status: DC | PRN

## 2013-02-26 MED ORDER — PHENYLEPHRINE 40 MCG/ML (10ML) SYRINGE FOR IV PUSH (FOR BLOOD PRESSURE SUPPORT)
80.0000 ug | PREFILLED_SYRINGE | INTRAVENOUS | Status: DC | PRN
  Filled 2013-02-26: qty 5
  Filled 2013-02-26: qty 2

## 2013-02-26 MED ORDER — ACETAMINOPHEN 325 MG PO TABS: 650.0000 mg | ORAL_TABLET | ORAL | Status: DC | PRN

## 2013-02-26 MED ORDER — OXYCODONE-ACETAMINOPHEN 5-325 MG PO TABS
1.0000 | ORAL_TABLET | ORAL | Status: DC | PRN
  Administered 2013-02-26 – 2013-02-27 (×3): 1 via ORAL
  Filled 2013-02-26 (×3): qty 1

## 2013-02-26 MED ORDER — IBUPROFEN 600 MG PO TABS: 600.0000 mg | ORAL_TABLET | Freq: Four times a day (QID) | ORAL | Status: DC | PRN

## 2013-02-26 MED ORDER — ZOLPIDEM TARTRATE 5 MG PO TABS: 5.0000 mg | ORAL_TABLET | Freq: Every evening | ORAL | Status: DC | PRN

## 2013-02-26 MED ORDER — SENNOSIDES-DOCUSATE SODIUM 8.6-50 MG PO TABS
2.0000 | ORAL_TABLET | Freq: Every day | ORAL | Status: DC
  Administered 2013-02-26 – 2013-02-27 (×2): 2 via ORAL

## 2013-02-26 MED ORDER — LACTATED RINGERS IV SOLN: 500.0000 mL | Freq: Once | INTRAVENOUS | Status: DC

## 2013-02-26 MED ORDER — OXYTOCIN 40 UNITS IN LACTATED RINGERS INFUSION - SIMPLE MED
62.5000 mL/h | INTRAVENOUS | Status: DC
  Filled 2013-02-26: qty 1000

## 2013-02-26 MED ORDER — OXYCODONE-ACETAMINOPHEN 5-325 MG PO TABS: 1.0000 | ORAL_TABLET | ORAL | Status: DC | PRN

## 2013-02-26 MED ORDER — CITRIC ACID-SODIUM CITRATE 334-500 MG/5ML PO SOLN: 30.0000 mL | ORAL | Status: DC | PRN

## 2013-02-26 NOTE — Anesthesia Procedure Notes (Signed)
Epidural Patient location during procedure: OB Start time: 02/26/2013 11:13 AM  Staffing Performed by: anesthesiologist   Preanesthetic Checklist Completed: patient identified, site marked, surgical consent, pre-op evaluation, timeout performed, IV checked, risks and benefits discussed and monitors and equipment checked  Epidural Patient position: sitting Prep: site prepped and draped and DuraPrep Patient monitoring: continuous pulse ox and blood pressure Approach: midline Injection technique: LOR air  Needle:  Needle type: Tuohy  Needle gauge: 17 G Needle length: 9 cm and 9 Needle insertion depth: 6 cm Catheter type: closed end flexible Catheter size: 19 Gauge Catheter at skin depth: 11 cm Test dose: negative  Assessment Events: blood not aspirated, injection not painful, no injection resistance, negative IV test and no paresthesia  Additional Notes Discussed risk of headache, infection, bleeding, nerve injury and failed or incomplete block.  Patient voices understanding and wishes to proceed.  Epidural placed easily on first attempt.  No paresthesia.  Patient tolerated procedure well with no apparent complications.  Jasmine December, MDReason for block:procedure for pain

## 2013-02-26 NOTE — Progress Notes (Signed)
Subjective:  G4P2A1 39 wks induction of labor Doing well, pain none, UCs mild, irregular  Anesthesia none   Objective: BP 117/77  Pulse 102  Temp(Src) 97.8 F (36.6 C) (Oral)  Resp 20  Ht 5\' 2"  (1.575 m)  Wt 83.915 kg (185 lb)  BMI 33.83 kg/m2  LMP 06/05/2012   FHT:  FHR: 140 bpm, variability: moderate,  accelerations:  Present,  decelerations:  Absent UC:   irregular, every 3-5 minutes VE:   Dilation: 3 Effacement (%): 60 Station: -3 Exam by:: Dr. Seymour Bars AROM AF clear ++  Assessment / Plan: 39 wks Multiparous with previous IOL x 2.  Favorable cervix. AROM, will add Pitocin as needed.   Fetal Wellbeing:  Category I Pain Control:  Epidural PRN in labor  Anticipated MOD:  NSVD  Benjamine Strout,MARIE-LYNE 02/26/2013, 9:32 AM

## 2013-02-26 NOTE — Progress Notes (Signed)
S: Doing well, no complaints, pain well controlled with epidural  O: BP 101/72  Pulse 104  Temp(Src) 98.1 F (36.7 C) (Oral)  Resp 20  Ht 5\' 2"  (1.575 m)  Wt 83.915 kg (185 lb)  BMI 33.83 kg/m2  SpO2 100%  LMP 06/05/2012   FHT:  FHR: 125s bpm, variability: moderate,  accelerations:  Present,  decelerations:  Absent UC:   regular, every 3 minutes, contracting on own after AROM a him in SVE:   Dilation: 5.5 Effacement (%): 70 Station: -2 Exam by:: Raliegh Ip RN   A / P:  37 y.o.  Obstetric History   G4   P2   T2   P0   A1   TAB0   SAB1   E0   M0   L2    at [redacted]w[redacted]d Induction of labor due to elective, term, favorable cervix,  progressing well on pitocin  Fetal Wellbeing:  Category I Pain Control:  Epidural  Anticipated MOD:  NSVD  Karen Monroe A. 02/26/2013, 1:27 PM

## 2013-02-26 NOTE — Anesthesia Preprocedure Evaluation (Signed)
Anesthesia Evaluation  Patient identified by MRN, date of birth, ID band Patient awake    Reviewed: Allergy & Precautions, H&P , NPO status , Patient's Chart, lab work & pertinent test results, reviewed documented beta blocker date and time   History of Anesthesia Complications Negative for: history of anesthetic complications  Airway Mallampati: I TM Distance: >3 FB Neck ROM: full    Dental  (+) Teeth Intact   Pulmonary neg pulmonary ROS,  breath sounds clear to auscultation        Cardiovascular negative cardio ROS  Rhythm:regular Rate:Normal     Neuro/Psych negative neurological ROS  negative psych ROS   GI/Hepatic negative GI ROS, Neg liver ROS,   Endo/Other  negative endocrine ROS  Renal/GU negative Renal ROS     Musculoskeletal   Abdominal   Peds  Hematology  (+) anemia ,   Anesthesia Other Findings   Reproductive/Obstetrics (+) Pregnancy                           Anesthesia Physical Anesthesia Plan  ASA: II  Anesthesia Plan: Epidural   Post-op Pain Management:    Induction:   Airway Management Planned:   Additional Equipment:   Intra-op Plan:   Post-operative Plan:   Informed Consent: I have reviewed the patients History and Physical, chart, labs and discussed the procedure including the risks, benefits and alternatives for the proposed anesthesia with the patient or authorized representative who has indicated his/her understanding and acceptance.     Plan Discussed with:   Anesthesia Plan Comments:         Anesthesia Quick Evaluation  

## 2013-02-26 NOTE — H&P (Signed)
Karen Monroe is a 37 y.o. [id number] at [redacted]w[redacted]d presenting for elective IOL at term with ripe cervix. Pt notes occasional contractions. Good fetal movement, No vaginal bleeding, not leaking fluid.  P[facility] at [facility] since 9 wks - dated by 9 wk u/s, unsure LMP - AMA, nl NT, nl AFP - early radiation exposure- pelvic CT at 3 wks - 5 cm simple L ovarian cyst, non-vascular, stable through pregnancy - Anemia, on iron - failed 1 hr DS, nl 3 hr, 20# wt gain in preg   Prenatal Transfer Tool  Maternal Diabetes: No Genetic Screening: Normal Maternal Ultrasounds/Referrals: Normal Fetal Ultrasounds or other Referrals:  None Maternal Substance Abuse:  No Significant Maternal Medications:  None Significant Maternal Lab Results: None     OB History   Grav Para Term Preterm Abortions TAB SAB Ect Mult Living   4 2 2  1  1   2      Past Medical History  Diagnosis Date  . AMA (advanced maternal age) multigravida 35+   . Anemia   . Hx of varicella   . 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 No past surgeries     Family History: family history is negative for Arthritis, and Alcohol abuse, and Asthma, and Birth defects, and Cancer, and COPD, and Depression, and Diabetes, and Drug abuse, and Early death, and Hearing loss, and Heart disease, and Hyperlipidemia, and Hypertension, and Kidney disease, and Learning disabilities, and Mental illness, and Mental retardation, and Miscarriages / Stillbirths, and Stroke, and Vision loss, . Social History:  reports that she has never smoked. She has never used smokeless tobacco. She reports that she does not drink alcohol or use illicit drugs.  Review of Systems - Negative except pregnancy   Dilation: 10 Effacement (%): 100 Station: +2 Exam by:: Susie Nix RN Blood pressure 124/63, pulse 113, temperature 98.8 F (37.1 C), temperature source Oral, resp. rate 20, height 5\' 2"  (1.575 m), weight  83.915 kg (185 lb), last menstrual period 06/05/2012, SpO2 97.00%.  Physical Exam:  Gen: well appearing, no distress CV: RRR Pulm: CTAB Back: no CVAT Abd: gravid, NT, no RUQ pain LE: trace edema, equal bilaterally, non-tender   Prenatal labs: ABO, Rh: A/Positive/-- (01/22 0000) Antibody: Negative (01/22 0000) Rubella:  immune RPR: NON REACTIVE (08/11 0727)  HBsAg: Negative (01/22 0000)  HIV: Non-reactive (01/22 0000)  GBS: Negative (07/15 0000)  1 hr Glucola 158, nl 3 hr  Genetic screening nl AFP, nl NT Anatomy US nl anatomy   Assessment/Plan: 37 y.o. [id number] at [redacted]w[redacted]d - elective IOL at term with ripe cvx, plan AROM, pitocin if needed - Reactive fetal testing

## 2013-02-27 LAB — CBC
Hemoglobin: 9.5 g/dL — ABNORMAL LOW (ref 12.0–15.0)
Platelets: 206 10*3/uL (ref 150–400)
RBC: 3.16 MIL/uL — ABNORMAL LOW (ref 3.87–5.11)
WBC: 9.5 10*3/uL (ref 4.0–10.5)

## 2013-02-27 MED ORDER — POLYSACCHARIDE IRON COMPLEX 150 MG PO CAPS
150.0000 mg | ORAL_CAPSULE | Freq: Every day | ORAL | Status: DC
  Administered 2013-02-27: 150 mg via ORAL
  Filled 2013-02-27 (×2): qty 1

## 2013-02-27 NOTE — Progress Notes (Signed)
Patient ID: Karen Monroe, female   DOB: 04-Nov-1975, 37 y.o.   MRN: [id number] PPD # 1 SVD  S:  Reports feeling well             Tolerating po/ No nausea or vomiting             Bleeding is light             Pain controlled with ibuprofen (OTC)             Up ad lib / ambulatory / voiding without difficulties    Newborn  Information for the patient's newborn:  [name], [name] [[id number]]  Female; breast feeding   O:  A & O x 3, in no apparent distress, very pleasant             VS:  Filed Vitals:   02/26/13 1930 02/26/13 2357 02/27/13 0540 02/27/13 0800  BP: 119/79 112/72 113/81 108/70  Pulse: 99 92 92 96  Temp: 98.3 F (36.8 C) 97.6 F (36.4 C) 98.3 F (36.8 C) 97.9 F (36.6 C)  TempSrc: Oral Axillary Oral Oral  Resp: 18 18 18 18   Height:      Weight:      SpO2:  98%  96%    LABS:  Recent Labs  02/26/13 0727 02/27/13 0625  WBC 6.3 9.5  HGB 10.5* 9.5*  HCT 30.2* 27.8*  PLT 243 206    Blood type: A/Positive/-- (01/22 0000)  Rubella: Immune (01/22 0000)    Lungs: Clear and unlabored  Heart: regular rate and rhythm / no murmurs  Abdomen: soft, non-tender, non-distended, normal bowel sounds             Fundus: firm, non-tender, U-1  Perineum: 2nd degree repair healing well, no edema  Lochia: light  Extremities: no edema, no calf pain or tenderness, no Homans    A/P: PPD # 1  37 y.o., [id number]   Principal Problem:    Postpartum care following vaginal delivery (8/11)    Doing well - stable status  Routine post partum orders  Anticipate discharge tomorrow   Raelyn Mora, M, MSN, CNM 02/27/2013, 3:21 PM

## 2013-02-27 NOTE — Anesthesia Postprocedure Evaluation (Signed)
  Anesthesia Post-op Note  Anesthesia Post Note  Patient: Karen Monroe  Procedure(s) Performed: * No procedures listed *  Anesthesia type: Epidural  Patient location: Mother/Baby  Post pain: Pain level controlled  Post assessment: Post-op Vital signs reviewed  Last Vitals:  Filed Vitals:   02/27/13 0540  BP: 113/81  Pulse: 92  Temp: 36.8 C  Resp: 18    Post vital signs: Reviewed  Level of consciousness:alert  Complications: No apparent anesthesia complications

## 2013-02-28 MED ORDER — IBUPROFEN 600 MG PO TABS: 600.0000 mg | ORAL_TABLET | Freq: Four times a day (QID) | ORAL | Status: DC

## 2013-02-28 MED ORDER — POLYSACCHARIDE IRON COMPLEX 150 MG PO CAPS: 150.0000 mg | ORAL_CAPSULE | Freq: Every day | ORAL | Status: DC

## 2013-02-28 NOTE — Lactation Note (Signed)
This note was copied from the chart of Karen Zelena Bushong. Lactation Consultation Note  Patient Name: Karen Monroe [id number] Date: 02/28/2013 Reason for consult: Follow-up assessment   Maternal Data    Feeding    LATCH Score/Interventions                      Lactation Tools Discussed/Used     Consult Status Consult Status: Complete  Experienced BF mom reports that baby was more fussy through the night. Now is asleep in mom's arms- she reports that baby last fed about 2 hours ago but has just gotten sound asleep. No questions at present. To call prn.  Pamelia Hoit 02/28/2013, 8:40 AM

## 2013-02-28 NOTE — Progress Notes (Addendum)
PPD #2- SVD  Subjective:   Reports feeling good Tolerating po/ No nausea or vomiting Bleeding is light Pain controlled with Motrin Up ad lib / ambulatory / voiding without problems Newborn: breastfeeding    Objective:   VS: Temp:  [98 F (36.7 C)-98.2 F (36.8 C)] 98 F (36.7 C) (08/13 0535) Pulse Rate:  [84-88] 88 (08/13 0535) Resp:  [20] 20 (08/13 0535) BP: (103-105)/(71-72) 103/72 mmHg (08/13 0535)  LABS:  Recent Labs  02/26/13 0727 02/27/13 0625  WBC 6.3 9.5  HGB 10.5* 9.5*  PLT 243 206   Blood type: A/Positive/-- (01/22 0000) Rubella: Immune (01/22 0000)                I&O: Intake/Output     08/12 0701 - 08/13 0700 08/13 0701 - 08/14 0700   Blood     Total Output       Net              Physical Exam: Alert and oriented X3 Lungs: Clear and unlabored Heart: regular rate and rhythm / no mumurs Abdomen: soft, non-tender, non-distended  Fundus: firm, non-tender, U-2 Perineum: Well approximated, no significant erythema, edema, or drainage; healing well. Lochia: small Extremities: negative edema, no calf pain or tenderness    Assessment: PPD # 2 [id number]/ S/P:induced vaginal ABL anemia-stable Doing well - stable for discharge home   Plan: Discharge home RX's:  Ibuprofen 600mg  po Q 6 hrs prn pain #30 Refill x 0 Niferex 150mg  po QD/BID #30/#60 Refill x 0 Colace 100mg  po BID Routine pp visit in 6wks WOB/GYN booklet given    Donette Larry, N MSN, CNM 02/28/2013, 8:29 AM

## 2013-02-28 NOTE — Discharge Summary (Signed)
Obstetric Discharge Summary Reason for Admission: induction of labor Prenatal Procedures: none Intrapartum Procedures: spontaneous vaginal delivery Postpartum Procedures: none Complications-Operative and Postpartum: 2nd degree perineal laceration Hemoglobin  Date Value Range Status  02/27/2013 9.5* 12.0 - 15.0 g/dL Final     HCT  Date Value Range Status  02/27/2013 27.8* 36.0 - 46.0 % Final    Physical Exam:  General: alert and cooperative Lochia: appropriate Uterine Fundus: firm Incision: healing well, no significant drainage, no dehiscence, no significant erythema DVT Evaluation: No evidence of DVT seen on physical exam. Negative Homan's sign.  Discharge Diagnoses: Term Pregnancy-delivered  Discharge Information: Date: 02/28/2013 Activity: pelvic rest Diet: routine Medications: PNV, Ibuprofen, Colace and Iron Condition: stable Instructions: refer to practice specific booklet Discharge to: home Follow-up Information   Follow up with [facility] A[facility] MD. Schedule an appointment as soon as possible for a visit in 6 weeks.   Specialty:  Obstetrics and Gynecology   Contact information:   [address] [place] [address] [phone]       Newborn Data: Live born female on [birth year] Birth Weight: 7 lb 10.8 oz (3481 g) APGAR: 7, 9  Home with mother.  Curlie Sittner, N 02/28/2013, 8:33 AM

## 2013-03-05 ENCOUNTER — Inpatient Hospital Stay (HOSPITAL_COMMUNITY): Admission: AD | Admit: 2013-03-05 | Payer: Self-pay | Source: Ambulatory Visit | Admitting: Obstetrics

## 2013-03-08 ENCOUNTER — Ambulatory Visit (HOSPITAL_COMMUNITY)
Admission: RE | Admit: 2013-03-08 | Discharge: 2013-03-08 | Disposition: A | Discharge status: Home / Self Care | Payer: BC Managed Care – PPO | Source: Ambulatory Visit | Attending: Obstetrics | Admitting: Obstetrics

## 2013-03-08 NOTE — Lactation Note (Signed)
Adult Lactation Consultation Outpatient Visit Note  Patient Name: Karen Monroe              BABY: [name] Date of Birth: 12[birth year]                     DOB: [birth year] Gestational Age at Delivery: [redacted]w[redacted]d   BIRTH WEIGHT: 7-10.8 Type of Delivery: NVD                        DISCHARGE WEIGHT: 7-2.8                                                             WEIGHT TODAY:6-12.8 Breastfeeding History: Frequency of Breastfeeding: EVERY 3 HOURS Length of Feeding: 20-30 MINUTES BOTH SIDES Voids: QS Stools: QS YELLOW  Supplementing / Method: Pumping:  Type of Pump:PUMP IN STYLE   Frequency:  Volume:  3 OZ  Comments:    Consultation Evaluation: Mom and 10 day old baby here for feeding assessment.  She said baby has had a difficult latch since discharge.  Mom has done some post pumping and dropper feeds with 10 mls of EBM.  Smart start nurse came 2 days ago and brought a nipple shield which has improved latch and feedings greatly.  Baby had continued to lose weight but now that baby is nursing well with shield he is gaining 1 oz per day.  Observed a good latch with shield and active suck/swallows.  Baby content after feeding and breast softened.  Discussed how to wean from shield when baby is ready.  Encouraged breastfeeding support group for weight checks and support.  Initial Feeding Assessment:right breast 20 MINUTES/LEFT BREAST 15 MINUTES Pre-feed [id number] Post-feed [id number] Amount Transferred:42 MLS Comments:  Additional Feeding Assessment: Pre-feed Weight: Post-feed Weight: Amount Transferred: Comments:  Additional Feeding Assessment: Pre-feed Weight: Post-feed Weight: Amount Transferred: Comments:  Total Breast milk Transferred this Visit: 42 MLS Total Supplement Given: NONE  Additional Interventions:   Follow-Up SMART START VISIT 03/13/13      Hansel Feinstein 03/08/2013, 12:24 PM

## 2013-03-09 ENCOUNTER — Ambulatory Visit (HOSPITAL_COMMUNITY): Payer: BC Managed Care – PPO

## 2014-05-20 ENCOUNTER — Encounter (HOSPITAL_COMMUNITY): Payer: Self-pay

## 2014-08-26 ENCOUNTER — Ambulatory Visit: Payer: BC Managed Care – PPO | Admitting: Family Medicine

## 2015-06-15 ENCOUNTER — Encounter (HOSPITAL_COMMUNITY): Payer: Self-pay | Admitting: Emergency Medicine

## 2015-06-15 ENCOUNTER — Emergency Department (HOSPITAL_COMMUNITY): Payer: BLUE CROSS/BLUE SHIELD

## 2015-06-15 ENCOUNTER — Emergency Department (HOSPITAL_COMMUNITY)
Admission: EM | Admit: 2015-06-15 | Discharge: 2015-06-15 | Disposition: A | Discharge status: Home / Self Care | Payer: BLUE CROSS/BLUE SHIELD | Attending: Emergency Medicine | Admitting: Emergency Medicine

## 2015-06-15 DIAGNOSIS — D649 Anemia, unspecified: Secondary | ICD-10-CM | POA: Insufficient documentation

## 2015-06-15 DIAGNOSIS — Z87442 Personal history of urinary calculi: Secondary | ICD-10-CM | POA: Diagnosis not present

## 2015-06-15 DIAGNOSIS — Z79899 Other long term (current) drug therapy: Secondary | ICD-10-CM | POA: Insufficient documentation

## 2015-06-15 DIAGNOSIS — Z791 Long term (current) use of non-steroidal anti-inflammatories (NSAID): Secondary | ICD-10-CM | POA: Insufficient documentation

## 2015-06-15 DIAGNOSIS — R1032 Left lower quadrant pain: Secondary | ICD-10-CM | POA: Diagnosis present

## 2015-06-15 DIAGNOSIS — H9193 Unspecified hearing loss, bilateral: Secondary | ICD-10-CM | POA: Insufficient documentation

## 2015-06-15 DIAGNOSIS — R109 Unspecified abdominal pain: Secondary | ICD-10-CM

## 2015-06-15 DIAGNOSIS — N83202 Unspecified ovarian cyst, left side: Secondary | ICD-10-CM

## 2015-06-15 DIAGNOSIS — R10A2 Flank pain, left side: Secondary | ICD-10-CM

## 2015-06-15 DIAGNOSIS — Z3202 Encounter for pregnancy test, result negative: Secondary | ICD-10-CM | POA: Insufficient documentation

## 2015-06-15 DIAGNOSIS — Z8619 Personal history of other infectious and parasitic diseases: Secondary | ICD-10-CM | POA: Insufficient documentation

## 2015-06-15 LAB — COMPREHENSIVE METABOLIC PANEL
ALBUMIN: 4.6 g/dL (ref 3.5–5.0)
ALK PHOS: 51 U/L (ref 38–126)
ALT: 19 U/L (ref 14–54)
AST: 19 U/L (ref 15–41)
Anion gap: 8 (ref 5–15)
BILIRUBIN TOTAL: 0.3 mg/dL (ref 0.3–1.2)
BUN: 12 mg/dL (ref 6–20)
CALCIUM: 8.9 mg/dL (ref 8.9–10.3)
CO2: 26 mmol/L (ref 22–32)
CREATININE: 0.79 mg/dL (ref 0.44–1.00)
Chloride: 104 mmol/L (ref 101–111)
Glucose, Bld: 115 mg/dL — ABNORMAL HIGH (ref 65–99)
Potassium: 4.3 mmol/L (ref 3.5–5.1)
SODIUM: 138 mmol/L (ref 135–145)
Total Protein: 7.5 g/dL (ref 6.5–8.1)

## 2015-06-15 LAB — CBC
HCT: 39.8 % (ref 36.0–46.0)
Hemoglobin: 13.5 g/dL (ref 12.0–15.0)
MCH: 30.8 pg (ref 26.0–34.0)
MCHC: 33.9 g/dL (ref 30.0–36.0)
MCV: 90.9 fL (ref 78.0–100.0)
PLATELETS: 303 10*3/uL (ref 150–400)
RBC: 4.38 MIL/uL (ref 3.87–5.11)
RDW: 12 % (ref 11.5–15.5)
WBC: 8.1 10*3/uL (ref 4.0–10.5)

## 2015-06-15 LAB — URINALYSIS, ROUTINE W REFLEX MICROSCOPIC
Bilirubin Urine: NEGATIVE
GLUCOSE, UA: NEGATIVE mg/dL
HGB URINE DIPSTICK: NEGATIVE
KETONES UR: NEGATIVE mg/dL
LEUKOCYTES UA: NEGATIVE
Nitrite: NEGATIVE
PROTEIN: NEGATIVE mg/dL
Specific Gravity, Urine: 1.031 — ABNORMAL HIGH (ref 1.005–1.030)
pH: 7 (ref 5.0–8.0)

## 2015-06-15 LAB — WET PREP, GENITAL
Clue Cells Wet Prep HPF POC: NONE SEEN
Sperm: NONE SEEN
TRICH WET PREP: NONE SEEN
Yeast Wet Prep HPF POC: NONE SEEN

## 2015-06-15 LAB — POC URINE PREG, ED: PREG TEST UR: NEGATIVE

## 2015-06-15 LAB — LIPASE, BLOOD: LIPASE: 37 U/L (ref 11–51)

## 2015-06-15 MED ORDER — MORPHINE SULFATE (PF) 4 MG/ML IV SOLN: 2.0000 mg | Freq: Once | INTRAVENOUS | Status: DC

## 2015-06-15 MED ORDER — KETOROLAC TROMETHAMINE 30 MG/ML IJ SOLN
30.0000 mg | Freq: Once | INTRAMUSCULAR | Status: AC
  Administered 2015-06-15: 30 mg via INTRAVENOUS
  Filled 2015-06-15: qty 1

## 2015-06-15 MED ORDER — HYDROMORPHONE HCL 1 MG/ML IJ SOLN
1.0000 mg | Freq: Once | INTRAMUSCULAR | Status: AC
  Administered 2015-06-15: 1 mg via INTRAVENOUS
  Filled 2015-06-15: qty 1

## 2015-06-15 MED ORDER — HYDROCODONE-ACETAMINOPHEN 5-325 MG PO TABS: 1.0000 | ORAL_TABLET | ORAL | Status: DC | PRN

## 2015-06-15 MED ORDER — ONDANSETRON HCL 4 MG/2ML IJ SOLN
4.0000 mg | Freq: Once | INTRAMUSCULAR | Status: AC
  Administered 2015-06-15: 4 mg via INTRAVENOUS
  Filled 2015-06-15: qty 2

## 2015-06-15 MED ORDER — MORPHINE SULFATE (PF) 4 MG/ML IV SOLN
4.0000 mg | Freq: Once | INTRAVENOUS | Status: AC
  Administered 2015-06-15: 4 mg via INTRAVENOUS
  Filled 2015-06-15: qty 1

## 2015-06-15 NOTE — Discharge Instructions (Signed)
Take the prescribed medication as directed.  This medication can make you sleepy/drowsy. Follow-up with your OB-GYN for repeat ultrasound for monitoring of cyst. Return to the ED for new or worsening symptoms.  Ovarian Cyst An ovarian cyst is a fluid-filled sac that forms on an ovary. The ovaries are small organs that produce eggs in women. Various types of cysts can form on the ovaries. Most are not cancerous. Many do not cause problems, and they often go away on their own. Some may cause symptoms and require treatment. Common types of ovarian cysts include:  Functional cysts--These cysts may occur every month during the menstrual cycle. This is normal. The cysts usually go away with the next menstrual cycle if the woman does not get pregnant. Usually, there are no symptoms with a functional cyst.  Endometrioma cysts--These cysts form from the tissue that lines the uterus. They are also called "chocolate cysts" because they become filled with blood that turns brown. This type of cyst can cause pain in the lower abdomen during intercourse and with your menstrual period.  Cystadenoma cysts--This type develops from the cells on the outside of the ovary. These cysts can get very big and cause lower abdomen pain and pain with intercourse. This type of cyst can twist on itself, cut off its blood supply, and cause severe pain. It can also easily rupture and cause a lot of pain.  Dermoid cysts--This type of cyst is sometimes found in both ovaries. These cysts may contain different kinds of body tissue, such as skin, teeth, hair, or cartilage. They usually do not cause symptoms unless they get very big.  Theca lutein cysts--These cysts occur when too much of a certain hormone (human chorionic gonadotropin) is produced and overstimulates the ovaries to produce an egg. This is most common after procedures used to assist with the conception of a baby (in vitro fertilization). CAUSES   Fertility drugs can  cause a condition in which multiple large cysts are formed on the ovaries. This is called ovarian hyperstimulation syndrome.  A condition called polycystic ovary syndrome can cause hormonal imbalances that can lead to nonfunctional ovarian cysts. SIGNS AND SYMPTOMS  Many ovarian cysts do not cause symptoms. If symptoms are present, they may include:  Pelvic pain or pressure.  Pain in the lower abdomen.  Pain during sexual intercourse.  Increasing girth (swelling) of the abdomen.  Abnormal menstrual periods.  Increasing pain with menstrual periods.  Stopping having menstrual periods without being pregnant. DIAGNOSIS  These cysts are commonly found during a routine or annual pelvic exam. Tests may be ordered to find out more about the cyst. These tests may include:  Ultrasound.  X-ray of the pelvis.  CT scan.  MRI.  Blood tests. TREATMENT  Many ovarian cysts go away on their own without treatment. Your health care provider may want to check your cyst regularly for 2-3 months to see if it changes. For women in menopause, it is particularly important to monitor a cyst closely because of the higher rate of ovarian cancer in menopausal women. When treatment is needed, it may include any of the following:  A procedure to drain the cyst (aspiration). This may be done using a long needle and ultrasound. It can also be done through a laparoscopic procedure. This involves using a thin, lighted tube with a tiny camera on the end (laparoscope) inserted through a small incision.  Surgery to remove the whole cyst. This may be done using laparoscopic surgery or an open  surgery involving a larger incision in the lower abdomen.  Hormone treatment or birth control pills. These methods are sometimes used to help dissolve a cyst. HOME CARE INSTRUCTIONS   Only take over-the-counter or prescription medicines as directed by your health care provider.  Follow up with your health care provider as  directed.  Get regular pelvic exams and Pap tests. SEEK MEDICAL CARE IF:   Your periods are late, irregular, or painful, or they stop.  Your pelvic pain or abdominal pain does not go away.  Your abdomen becomes larger or swollen.  You have pressure on your bladder or trouble emptying your bladder completely.  You have pain during sexual intercourse.  You have feelings of fullness, pressure, or discomfort in your stomach.  You lose weight for no apparent reason.  You feel generally ill.  You become constipated.  You lose your appetite.  You develop acne.  You have an increase in body and facial hair.  You are gaining weight, without changing your exercise and eating habits.  You think you are pregnant. SEEK IMMEDIATE MEDICAL CARE IF:   You have increasing abdominal pain.  You feel sick to your stomach (nauseous), and you throw up (vomit).  You develop a fever that comes on suddenly.  You have abdominal pain during a bowel movement.  Your menstrual periods become heavier than usual. MAKE SURE YOU:  Understand these instructions.  Will watch your condition.  Will get help right away if you are not doing well or get worse.   This information is not intended to replace advice given to you by your health care provider. Make sure you discuss any questions you have with your health care provider.   Document Released: 07/05/2005 Document Revised: 07/10/2013 Document Reviewed: 03/12/2013 Elsevier Interactive Patient Education [employer].

## 2015-06-15 NOTE — ED Notes (Signed)
Pt states that she has had lt flank pain since 0800 this morning.  Denies NVD.  Denies dysuria.  States she came in for the same pain 3 years ago.

## 2015-06-15 NOTE — ED Provider Notes (Signed)
CSN: [id number]     Arrival date & time 06/15/15  1234 History   First MD Initiated Contact with Patient 06/15/15 1336     Chief Complaint  Patient presents with  . Flank Pain     (Consider location/radiation/quality/duration/timing/severity/associated sxs/prior Treatment) HPI   39 year old female who presents for evaluation of abdominal pain. Patient report 8:00 this morning she developed acute onset of sharp crampy pain to the left low abdomen. Pain has been persistent nothing seems to make it better or worse. Despite taking ibuprofen she noticed no improvement. She did felt nauseous and vomited once prior to arrival. She denies any fever, chills, chest pain, shortness of breath, back pain, dysuria, hematuria, vaginal bleeding, vaginal discharge, or rash. She denies any recent injury. She has had similar pain episode approximately 3 years ago when she was told that   it may be related to a ruptured ovarian cyst. Her last menstrual period was 3 weeks ago. Pain is currently moderate at this time.   Past Medical History  Diagnosis Date  . AMA (advanced maternal age) multigravida 36+   . Anemia   . Hx of varicella   . 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 No past surgeries     Family History  Problem Relation Age of Onset  . Arthritis Neg Hx   . Alcohol abuse Neg Hx   . Asthma Neg Hx   . Birth defects Neg Hx   . Cancer Neg Hx   . COPD Neg Hx   . Depression Neg Hx   . Diabetes Neg Hx   . Drug abuse Neg Hx   . Early death Neg Hx   . Hearing loss Neg Hx   . Heart disease Neg Hx   . Hyperlipidemia Neg Hx   . Hypertension Neg Hx   . Kidney disease Neg Hx   . Learning disabilities Neg Hx   . Mental illness Neg Hx   . Mental retardation Neg Hx   . Miscarriages / Stillbirths Neg Hx   . Stroke Neg Hx   . Vision loss Neg Hx    Social History  Substance Use Topics  . Smoking status: Never Smoker   . Smokeless tobacco:  Never Used  . Alcohol Use: No   OB History    Gravida Para Term Preterm AB TAB SAB Ectopic Multiple Living   4 3 3  1  1   3      Review of Systems  All other systems reviewed and are negative.     Allergies  Review of patient's allergies indicates no known allergies.  Home Medications   Prior to Admission medications   Medication Sig Start Date End Date Taking? Authorizing Provider  ibuprofen (ADVIL,MOTRIN) 600 MG tablet Take 1 tablet (600 mg total) by mouth every 6 (six) hours. 02/28/13   Graciela Husbands, CNM  iron polysaccharides (NIFEREX) 150 MG capsule Take 1 capsule (150 mg total) by mouth daily. 02/28/13   Graciela Husbands, CNM  Multiple Vitamin (MULTIVITAMIN WITH MINERALS) TABS tablet Take 1 tablet by mouth daily.    Historical Provider, MD   BP 128/88 mmHg  Pulse 61  Temp(Src) 97.7 F (36.5 C) (Oral)  Resp 18  SpO2 99%  LMP 05/25/2015 (Approximate) Physical Exam  Constitutional: She appears well-developed and well-nourished. No distress.  HENT:  Head: Atraumatic.  Eyes: Conjunctivae are normal.  Neck: Neck supple.  Cardiovascular: Normal rate and regular rhythm.   Pulmonary/Chest: Effort normal and breath sounds normal.  Abdominal: Soft. Bowel sounds are normal. She exhibits no distension. There is tenderness (Left lower quadrant abdominal tenderness to palpation without guarding or rebound tenderness.).  No CVA tenderness.  Genitourinary:  Pelvic exam: RN in room as chaperone, external female genitalia normal with no signs of lesions or injuries. Speculum exam shows normal cervix with no obvious discharge. Bimanual exam with no adnexal tenderness, no cervical motion tenderness, uterus normal size and nontender, no masses appreciated. The external cervical os is closed.   Neurological: She is alert.  Skin: No rash noted.  Psychiatric: She has a normal mood and affect.  Nursing note and vitals reviewed.   ED Course  Procedures (including critical care  time)  Patient presents with left flank and left lower quadrant abdominal pain. History of nonobstructive kidney stones the past. History of ovarian cyst as well. She appears uncomfortable, pain medication given.  3:24 PM Patient is afebrile, vital signs stable. Her pregnancy test is negative. A urine shows no signs of urinary tract infection. Wet prep with many WBC she does not have any pain to suggest pelvic inflammatory disease or ovarian torsion. Her labs are reassuring.  3:44 PM Currently awaits CT scan of abd/pelvis.  Care discussed with oncoming provider to f/u on CT result and monitor pain.  Prior CT scan showing nonobstructive kidney stones.  Additional pain medication given.   Labs Review Labs Reviewed  WET PREP, GENITAL - Abnormal; Notable for the following:    WBC, Wet Prep HPF POC MANY (*)    All other components within normal limits  COMPREHENSIVE METABOLIC PANEL - Abnormal; Notable for the following:    Glucose, Bld 115 (*)    All other components within normal limits  URINALYSIS, ROUTINE W REFLEX MICROSCOPIC (NOT AT [facility]) - Abnormal; Notable for the following:    Specific Gravity, Urine 1.031 (*)    All other components within normal limits  LIPASE, BLOOD  CBC  POC URINE PREG, ED  GC/CHLAMYDIA PROBE AMP ([place]) NOT AT [facility]    Imaging Review No results found. I have personally reviewed and evaluated these images and lab results as part of my medical decision-making.   EKG Interpretation None      MDM   Final diagnoses:  None    BP 128/88 mmHg  Pulse 61  Temp(Src) 97.7 F (36.5 C) (Oral)  Resp 18  SpO2 99%  LMP 05/25/2015 (Approximate)     Domenic Moras, PA-C 06/15/15 [place], MD 06/16/15 1527

## 2015-06-15 NOTE — ED Provider Notes (Signed)
Patient received in sign out from [facility].  Briefly ,39 y.o. F here with left flank and abdominal pain.  Some N/V noted.  No fever, chills.  Hx of ovarian cysts and kidney stones.  Pelvic exam done earlier by PA Rona Ravens, no tenderness noted, no significant discharge.  Wet prep with many WBC's.  Gc/chl pending.    Plan:  CT renal study pending.  Will follow results.  Pain controlled at this time.  Results for orders placed or performed during the hospital encounter of 06/15/15  Wet prep, genital  Result Value Ref Range   Yeast Wet Prep HPF POC NONE SEEN NONE SEEN   Trich, Wet Prep NONE SEEN NONE SEEN   Clue Cells Wet Prep HPF POC NONE SEEN NONE SEEN   WBC, Wet Prep HPF POC MANY (A) NONE SEEN   Sperm NONE SEEN   Lipase, blood  Result Value Ref Range   Lipase 37 11 - 51 U/L  Comprehensive metabolic panel  Result Value Ref Range   Sodium 138 135 - 145 mmol/L   Potassium 4.3 3.5 - 5.1 mmol/L   Chloride 104 101 - 111 mmol/L   CO2 26 22 - 32 mmol/L   Glucose, Bld 115 (H) 65 - 99 mg/dL   BUN 12 6 - 20 mg/dL   Creatinine, Ser 0.79 0.44 - 1.00 mg/dL   Calcium 8.9 8.9 - 10.3 mg/dL   Total Protein 7.5 6.5 - 8.1 g/dL   Albumin 4.6 3.5 - 5.0 g/dL   AST 19 15 - 41 U/L   ALT 19 14 - 54 U/L   Alkaline Phosphatase 51 38 - 126 U/L   Total Bilirubin 0.3 0.3 - 1.2 mg/dL   GFR calc non Af Amer >60 >60 mL/min   GFR calc Af Amer >60 >60 mL/min   Anion gap 8 5 - 15  CBC  Result Value Ref Range   WBC 8.1 4.0 - 10.5 K/uL   RBC 4.38 3.87 - 5.11 MIL/uL   Hemoglobin 13.5 12.0 - 15.0 g/dL   HCT 39.8 36.0 - 46.0 %   MCV 90.9 78.0 - 100.0 fL   MCH 30.8 26.0 - 34.0 pg   MCHC 33.9 30.0 - 36.0 g/dL   RDW 12.0 11.5 - 15.5 %   Platelets 303 150 - 400 K/uL  Urinalysis, Routine w reflex microscopic (not at [facility])  Result Value Ref Range   Color, Urine YELLOW YELLOW   APPearance CLEAR CLEAR   Specific Gravity, Urine 1.031 (H) 1.005 - 1.030   pH 7.0 5.0 - 8.0   Glucose, UA NEGATIVE NEGATIVE mg/dL   Hgb urine  dipstick NEGATIVE NEGATIVE   Bilirubin Urine NEGATIVE NEGATIVE   Ketones, ur NEGATIVE NEGATIVE mg/dL   Protein, ur NEGATIVE NEGATIVE mg/dL   Nitrite NEGATIVE NEGATIVE   Leukocytes, UA NEGATIVE NEGATIVE  POC urine preg, ED (not at [facility])  Result Value Ref Range   Preg Test, Ur NEGATIVE NEGATIVE   US Transvaginal Non-ob  06/15/2015  CLINICAL DATA:  Large adnexal cyst on CT scan earlier today. Left flank pain beginning at 8 a.m. this morning. Initial encounter. EXAM: TRANSABDOMINAL AND TRANSVAGINAL ULTRASOUND OF PELVIS DOPPLER ULTRASOUND OF OVARIES TECHNIQUE: Both transabdominal and transvaginal ultrasound examinations of the pelvis were performed. Transabdominal technique was performed for global imaging of the pelvis including uterus, ovaries, adnexal regions, and pelvic cul-de-sac. It was necessary to proceed with endovaginal exam following the transabdominal exam to visualize the adnexa. Color and duplex Doppler ultrasound was utilized  to evaluate blood flow to the ovaries. COMPARISON:  Pelvic ultrasound 06/21/2012.  CT scan earlier today. FINDINGS: Uterus Measurements: 9.2 x 4.9 x 6.2 cm. No fibroids or other mass visualized. Endometrium Thickness: 12 mm.  No focal abnormality visualized. Right ovary Measurements: 2.5 x 1.8 x 2.0 cm. Normal appearance/no adnexal mass. No hydrosalpinx. Left ovary Measurements: 2.2 x 2.4 x 2.4 cm. There is a cyst anterior to the uterus which is simple and appears to emanate from the left ovary based on correlation with CT scan and this study. The lesion measures measures 7.3 x 6.8 x 7.8 cm. No hydrosalpinx. Pulsed Doppler evaluation of both ovaries demonstrates normal low-resistance arterial and venous waveforms. Other findings No free pelvic fluid. IMPRESSION: Large simple appearing cystic lesion appears to emanate from the left ovary and could be an exophytic or paraovarian cyst. Follow-up ultrasound following the patient's next menstrual period is recommended. If this  is an exophytic cystic, it may place the patient at increased risk for ovarian torsion. There is no evidence of torsion on today's study. Electronically Signed   By: Inge Rise M.D.   On: 06/15/2015 18:55   US Pelvis Complete  06/15/2015  CLINICAL DATA:  Large adnexal cyst on CT scan earlier today. Left flank pain beginning at 8 a.m. this morning. Initial encounter. EXAM: TRANSABDOMINAL AND TRANSVAGINAL ULTRASOUND OF PELVIS DOPPLER ULTRASOUND OF OVARIES TECHNIQUE: Both transabdominal and transvaginal ultrasound examinations of the pelvis were performed. Transabdominal technique was performed for global imaging of the pelvis including uterus, ovaries, adnexal regions, and pelvic cul-de-sac. It was necessary to proceed with endovaginal exam following the transabdominal exam to visualize the adnexa. Color and duplex Doppler ultrasound was utilized to evaluate blood flow to the ovaries. COMPARISON:  Pelvic ultrasound 06/21/2012.  CT scan earlier today. FINDINGS: Uterus Measurements: 9.2 x 4.9 x 6.2 cm. No fibroids or other mass visualized. Endometrium Thickness: 12 mm.  No focal abnormality visualized. Right ovary Measurements: 2.5 x 1.8 x 2.0 cm. Normal appearance/no adnexal mass. No hydrosalpinx. Left ovary Measurements: 2.2 x 2.4 x 2.4 cm. There is a cyst anterior to the uterus which is simple and appears to emanate from the left ovary based on correlation with CT scan and this study. The lesion measures measures 7.3 x 6.8 x 7.8 cm. No hydrosalpinx. Pulsed Doppler evaluation of both ovaries demonstrates normal low-resistance arterial and venous waveforms. Other findings No free pelvic fluid. IMPRESSION: Large simple appearing cystic lesion appears to emanate from the left ovary and could be an exophytic or paraovarian cyst. Follow-up ultrasound following the patient's next menstrual period is recommended. If this is an exophytic cystic, it may place the patient at increased risk for ovarian torsion. There  is no evidence of torsion on today's study. Electronically Signed   By: Inge Rise M.D.   On: 06/15/2015 18:55   [place] Art/ven Flow Abd Pelv Doppler  06/15/2015  CLINICAL DATA:  Large adnexal cyst on CT scan earlier today. Left flank pain beginning at 8 a.m. this morning. Initial encounter. EXAM: TRANSABDOMINAL AND TRANSVAGINAL ULTRASOUND OF PELVIS DOPPLER ULTRASOUND OF OVARIES TECHNIQUE: Both transabdominal and transvaginal ultrasound examinations of the pelvis were performed. Transabdominal technique was performed for global imaging of the pelvis including uterus, ovaries, adnexal regions, and pelvic cul-de-sac. It was necessary to proceed with endovaginal exam following the transabdominal exam to visualize the adnexa. Color and duplex Doppler ultrasound was utilized to evaluate blood flow to the ovaries. COMPARISON:  Pelvic ultrasound 06/21/2012.  CT scan earlier today.  FINDINGS: Uterus Measurements: 9.2 x 4.9 x 6.2 cm. No fibroids or other mass visualized. Endometrium Thickness: 12 mm.  No focal abnormality visualized. Right ovary Measurements: 2.5 x 1.8 x 2.0 cm. Normal appearance/no adnexal mass. No hydrosalpinx. Left ovary Measurements: 2.2 x 2.4 x 2.4 cm. There is a cyst anterior to the uterus which is simple and appears to emanate from the left ovary based on correlation with CT scan and this study. The lesion measures measures 7.3 x 6.8 x 7.8 cm. No hydrosalpinx. Pulsed Doppler evaluation of both ovaries demonstrates normal low-resistance arterial and venous waveforms. Other findings No free pelvic fluid. IMPRESSION: Large simple appearing cystic lesion appears to emanate from the left ovary and could be an exophytic or paraovarian cyst. Follow-up ultrasound following the patient's next menstrual period is recommended. If this is an exophytic cystic, it may place the patient at increased risk for ovarian torsion. There is no evidence of torsion on today's study. Electronically Signed   By: Inge Rise M.D.   On: 06/15/2015 18:55   Ct Renal Stone Study  06/15/2015  CLINICAL DATA:  Left flank pain beginning at 8 a.m. today. Initial encounter. EXAM: CT ABDOMEN AND PELVIS WITHOUT CONTRAST TECHNIQUE: Multidetector CT imaging of the abdomen and pelvis was performed following the standard protocol without IV contrast. COMPARISON:  CT abdomen and pelvis and pelvic ultrasound 06/21/2012. FINDINGS: The lung bases are clear.  No pleural or pericardial effusion. There is no hydronephrosis on the right or left. No ureteral stones are seen. The patient has 3 small nonobstructing right renal stones. The largest measures 0.4 cm in diameter. A single punctate nonobstructing stone is seen in the midpole of the left kidney. The kidneys are otherwise unremarkable. The gallbladder, liver, spleen, adrenal glands and pancreas appear normal. There is some laxity of the anterior abdominal wall in the midline and a small fat containing umbilical hernia. The uterus is unremarkable. Previously seen left ovarian cyst is no longer visualized. There is a new cystic lesion anterior to the uterus which measures 7.0 cm transverse by 8.7 cm craniocaudal by 6.7 cm AP. Septated appearing structure along the right side of this lesion may be due to hydrosalpinx. The cyst results in mass effect on the urinary bladder. The left ovary is unremarkable. The stomach, small and large bowel and appendix appear normal. No lymphadenopathy or fluid is seen. No bony abnormality is identified. IMPRESSION: The patient has bilateral nonobstructing renal stones with a single stone seen on the left and 3 on the right. There is no hydronephrosis or ureteral stone. Large adnexal cyst anterior to the uterus cannot be definitively characterized. There appears to be hydrosalpinx on the right side of the cyst. Pelvic ultrasound is recommended for further evaluation. Left ovarian cyst seen on the comparison examinations has resolved. Lax appearance of the  anterior abdominal wall is small fat containing umbilical hernia, unchanged. Electronically Signed   By: Inge Rise M.D.   On: 06/15/2015 16:26    CT with bilateral nonobstructing renal stones. Also note large adnexal cyst, questionable hydrosalpinx. Patient will be sent for pelvic ultrasound for further evaluation.  U/s with left ovarian cyst which could be exophytic vs paraovarian, no evidence of torsion at this time.  Recommended follow-up ultrasound recommended.  Patient is established with [facility], will call to make appt tomorrow.  Rx vicodin for pain.  Discussed plan with patient, he/she acknowledged understanding and agreed with plan of care.  Return precautions given for new or worsening  symptoms.  Larene Pickett, PA-C 06/15/15 1922  Lacretia Leigh, MD 06/15/15 [phone]

## 2015-06-16 LAB — GC/CHLAMYDIA PROBE AMP (~~LOC~~) NOT AT ARMC
Chlamydia: NEGATIVE
Neisseria Gonorrhea: NEGATIVE

## 2015-09-19 ENCOUNTER — Ambulatory Visit: Payer: Self-pay | Admitting: Surgery

## 2015-09-19 NOTE — H&P (Signed)
Karen Monroe 09/19/2015 10:55 AM Location: [facility] Patient #: [id number] DOB: 1976/02/28 Married / Language: [name] / Race: White Female  History of Present Illness Marcello Moores A. Lanita Stammen MD; 09/19/2015 11:13 AM) Patient words: umb hernia   Patient sent at the request of Dr. Pamala Hurry for umbilical hernia. The patient is scheduled for a laparoscopic oophorectomy by Dr. Pamala Hurry. She has a small umbilical hernia that she wants repaired in the same setting. It causes mild discomfort. She has no nausea or vomiting. She has intermittent chronic left lower quadrant abdominal pain more than likely secondary to a left ovarian cyst. Otherwise she feels well.  The patient is a 40 year old female.   Other Problems [name], [place]; 09/19/2015 10:56 AM) Anxiety Disorder  Past Surgical History [name], [place]; 09/19/2015 10:56 AM) Oral Surgery  Diagnostic Studies History [name], [place]; 09/19/2015 10:56 AM) Colonoscopy never Mammogram never Pap Smear 1-5 years ago  Allergies [name], CMA; 09/19/2015 10:56 AM) No Known Drug Allergies 09/19/2015  Medication History [name], [place]; 09/19/2015 10:56 AM) Sertraline HCl (100MG Tablet, Oral) Active. Multi Vitamin (Oral) Active. Medications Reconciled  Social History [name], [place]; 09/19/2015 10:56 AM) Alcohol use Occasional alcohol use. Caffeine use Carbonated beverages, Coffee, Tea. No drug use Tobacco use Never smoker.  Family History [name], [place]; 09/19/2015 10:56 AM) Depression Brother. Melanoma Father.  Pregnancy / Birth History [name], [place]; 09/19/2015 10:56 AM) Age at menarche 13 years. Gravida 3 Maternal age 69-30 Para 3 Regular periods     Review of Systems [name] CMA; 09/19/2015 10:56 AM) General Not Present- Appetite Loss, Chills, Fatigue, Fever, Night Sweats, Weight Gain and Weight Loss. Skin Not Present- Change in  Wart/Mole, Dryness, Hives, Jaundice, New Lesions, Non-Healing Wounds, Rash and Ulcer. HEENT Not Present- Earache, Hearing Loss, Hoarseness, Nose Bleed, Oral Ulcers, Ringing in the Ears, Seasonal Allergies, Sinus Pain, Sore Throat, Visual Disturbances, Wears glasses/contact lenses and Yellow Eyes. Respiratory Not Present- Bloody sputum, Chronic Cough, Difficulty Breathing, Snoring and Wheezing. Breast Not Present- Breast Mass, Breast Pain, Nipple Discharge and Skin Changes. Cardiovascular Not Present- Chest Pain, Difficulty Breathing Lying Down, Leg Cramps, Palpitations, Rapid Heart Rate, Shortness of Breath and Swelling of Extremities. Gastrointestinal Not Present- Abdominal Pain, Bloating, Bloody Stool, Change in Bowel Habits, Chronic diarrhea, Constipation, Difficulty Swallowing, Excessive gas, Gets full quickly at meals, Hemorrhoids, Indigestion, Nausea, Rectal Pain and Vomiting. Female Genitourinary Present- Pelvic Pain. Not Present- Frequency, Nocturia, Painful Urination and Urgency. Musculoskeletal Not Present- Back Pain, Joint Pain, Joint Stiffness, Muscle Pain, Muscle Weakness and Swelling of Extremities. Neurological Not Present- Decreased Memory, Fainting, Headaches, Numbness, Seizures, Tingling, Tremor, Trouble walking and Weakness. Psychiatric Present- Anxiety. Not Present- Bipolar, Change in Sleep Pattern, Depression, Fearful and Frequent crying. Endocrine Not Present- Cold Intolerance, Excessive Hunger, Hair Changes, Heat Intolerance, Hot flashes and New Diabetes. Hematology Not Present- Easy Bruising, Excessive bleeding, Gland problems, HIV and Persistent Infections.  Vitals [employer] R. Brooks CMA; 09/19/2015 10:56 AM) 09/19/2015 10:55 AM Weight: 185.25 lb Height: 62in Body Surface Area: 1.85 m Body Mass Index: 33.88 kg/m  BP: 116/78 (Sitting, Left Arm, Standard)      Physical Exam (Taivon Haroon A. Carlosdaniel Grob MD; 09/19/2015 11:14 AM)  General Mental Status-Alert. General  Appearance-Consistent with stated age. Hydration-Well hydrated. Voice-Normal.  Head and Neck Head-normocephalic, atraumatic with no lesions or palpable masses. Trachea-midline. Thyroid Gland Characteristics - normal size and consistency.  Eye Eyeball - Bilateral-Extraocular movements intact. Sclera/Conjunctiva - Bilateral-No  scleral icterus.  Abdomen Note: Small reducible umbilical hernia. Soft nontender without rebound guarding or mass  Neurologic Neurologic evaluation reveals -alert and oriented x 3 with no impairment of recent or remote memory. Mental Status-Normal.  Musculoskeletal Normal Exam - Left-Upper Extremity Strength Normal and Lower Extremity Strength Normal. Normal Exam - Right-Upper Extremity Strength Normal and Lower Extremity Strength Normal.    Assessment & Plan (Mahogani Holohan A. Sahil Milner MD; 08/25/784 75:44 AM)  UMBILICAL HERNIA WITHOUT OBSTRUCTION AND WITHOUT GANGRENE (K42.9) Impression: Discuss repair with the patient. It is a small hernia and should not require mesh. The risk of hernia repair include bleeding, infection, organ injury, bowel injury, bladder injury, nerve injury recurrent hernia, blood clots, worsening of underlying condition, chronic pain, mesh use, open surgery, death, and the need for other operattions. Pt agrees to proceed  Current Plans You are being scheduled for surgery - Our schedulers will call you.  You should hear from our office's scheduling department within 5 working days about the location, date, and time of surgery. We try to make accommodations for patient's preferences in scheduling surgery, but sometimes the OR schedule or the surgeon's schedule prevents [place] from making those accommodations.  If you have not heard from our office [phone]) in 5 working days, call the office and ask for your surgeon's nurse.  If you have other questions about your diagnosis, plan, or surgery, call the office and ask for  your surgeon's nurse.  The anatomy & physiology of the abdominal wall was discussed. The pathophysiology of hernias was discussed. Natural history risks without surgery including progeressive enlargement, pain, incarceration, & strangulation was discussed. Contributors to complications such as smoking, obesity, diabetes, prior surgery, etc were discussed.  I feel the risks of no intervention will lead to serious problems that outweigh the operative risks; therefore, I recommended surgery to reduce and repair the hernia. I explained laparoscopic techniques with possible need for an open approach. I noted the probable use of mesh to patch and/or buttress the hernia repair  Risks such as bleeding, infection, abscess, need for further treatment, heart attack, death, and other risks were discussed. I noted a good likelihood this will help address the problem. Goals of post-operative recovery were discussed as well. Possibility that this will not correct all symptoms was explained. I stressed the importance of low-impact activity, aggressive pain control, avoiding constipation, & not pushing through pain to minimize risk of post-operative chronic pain or injury. Possibility of reherniation especially with smoking, obesity, diabetes, immunosuppression, and other health conditions was discussed. We will work to minimize complications.  An educational handout further explaining the pathology & treatment options was given as well. Questions were answered. The patient expresses understanding & wishes to proceed with surgery.  Pt Education - [facility] Pain Control (Gross) Pt Education - [facility] Hernia Post-Op HCI (Gross): discussed with patient and provided information.

## 2015-10-28 ENCOUNTER — Other Ambulatory Visit: Payer: Self-pay | Admitting: Obstetrics

## 2015-10-29 ENCOUNTER — Other Ambulatory Visit (HOSPITAL_COMMUNITY): Payer: BLUE CROSS/BLUE SHIELD

## 2015-11-13 ENCOUNTER — Other Ambulatory Visit (HOSPITAL_COMMUNITY): Payer: BLUE CROSS/BLUE SHIELD

## 2015-11-19 DIAGNOSIS — J019 Acute sinusitis, unspecified: Secondary | ICD-10-CM | POA: Diagnosis not present

## 2015-11-25 NOTE — Patient Instructions (Addendum)
Your procedure is scheduled on:  Thursday, Dec 04, 2015  Enter through the Main Entrance of [facility] at:  8:30 AM  Pick up the phone at the desk and dial [phone].  Call this number if you have problems the morning of surgery: [phone].  Remember: Do NOT eat food or drink after:  Midnight Wednesday  Take these medicines the morning of surgery with a SIP OF WATER:  Zoloft  Do NOT wear jewelry (body piercing), metal hair clips/bobby pins, make-up, or nail polish. Do NOT wear lotions, powders, or perfumes.  You may wear deodorant. Do NOT shave for 48 hours prior to surgery. Do NOT bring valuables to the hospital.  Have a responsible adult drive you home and stay with you for 24 hours after your procedure.  Home with husband Karen Monroe cell [phone] or Karen Monroe cell [phone].

## 2015-11-27 ENCOUNTER — Encounter (HOSPITAL_COMMUNITY): Payer: Self-pay

## 2015-11-27 ENCOUNTER — Encounter (HOSPITAL_COMMUNITY)
Admission: RE | Admit: 2015-11-27 | Discharge: 2015-11-27 | Disposition: A | Discharge status: Home / Self Care | Payer: BLUE CROSS/BLUE SHIELD | Source: Ambulatory Visit | Attending: Obstetrics | Admitting: Obstetrics

## 2015-11-27 DIAGNOSIS — Z01812 Encounter for preprocedural laboratory examination: Secondary | ICD-10-CM | POA: Insufficient documentation

## 2015-11-27 DIAGNOSIS — D649 Anemia, unspecified: Secondary | ICD-10-CM | POA: Diagnosis not present

## 2015-11-27 DIAGNOSIS — F329 Major depressive disorder, single episode, unspecified: Secondary | ICD-10-CM | POA: Diagnosis not present

## 2015-11-27 DIAGNOSIS — F419 Anxiety disorder, unspecified: Secondary | ICD-10-CM | POA: Diagnosis not present

## 2015-11-27 DIAGNOSIS — N83202 Unspecified ovarian cyst, left side: Secondary | ICD-10-CM | POA: Insufficient documentation

## 2015-11-27 DIAGNOSIS — K429 Umbilical hernia without obstruction or gangrene: Secondary | ICD-10-CM | POA: Diagnosis not present

## 2015-11-27 HISTORY — DX: Calculus of kidney: N20.0

## 2015-11-27 HISTORY — DX: Anxiety disorder, unspecified: F41.9

## 2015-11-27 HISTORY — DX: Depression, unspecified: F32.A

## 2015-11-27 HISTORY — DX: Other seasonal allergic rhinitis: J30.2

## 2015-11-27 HISTORY — DX: Major depressive disorder, single episode, unspecified: F32.9

## 2015-11-27 LAB — CBC
HCT: 37.7 % (ref 36.0–46.0)
HEMOGLOBIN: 13 g/dL (ref 12.0–15.0)
MCH: 30.2 pg (ref 26.0–34.0)
MCHC: 34.5 g/dL (ref 30.0–36.0)
MCV: 87.7 fL (ref 78.0–100.0)
PLATELETS: 300 10*3/uL (ref 150–400)
RBC: 4.3 MIL/uL (ref 3.87–5.11)
RDW: 12.9 % (ref 11.5–15.5)
WBC: 5.9 10*3/uL (ref 4.0–10.5)

## 2015-11-27 LAB — TYPE AND SCREEN
ABO/RH(D): A POS
ANTIBODY SCREEN: NEGATIVE

## 2015-11-27 LAB — ABO/RH: ABO/RH(D): A POS

## 2015-12-03 NOTE — H&P (Addendum)
CC: L adnexal cyst  HPI: 40 yo G3P2012 (SVD x 2) with no current plans for further fertility presents for laprascopic removal of L adnexal cyst. Pt was noted to have a 5 cm paraovarian cyst in 2015. In 11/16 she presented to the ER with acute LLQ pain and noted to have a 7 cm L ovarian cyst. There was also a concern for possible renal calculus at that time. Her pain subsided but f/u u/s 2/17 showed 8 cm simple L adenxal vs L ovarian cyst. CA-125 was 16.9. Given intermittent LLQ pain and the need for umbilical hernia repair, pt elects for combined surgery.   She had a normal pap 9/14 and contracepts with condoms.  Past Medical History  Diagnosis Date  . AMA (advanced maternal age) multigravida 11+   . Anemia   . Hx of varicella   . 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 Depression   . SVD (spontaneous vaginal delivery)     x 3  . Seasonal allergies   . Anxiety   . Kidney stone     still in kidney, causing no problems    Past Surgical History  Procedure Laterality Date  . Wisdom tooth extraction      Meds: Zoloft 11m NKDA, no latex allergy  PE: Def to OR  CBC    Component Value Date/Time   WBC 5.9 11/27/2015 1055   RBC 4.30 11/27/2015 1055   HGB 13.0 11/27/2015 1055   HCT 37.7 11/27/2015 1055   PLT 300 11/27/2015 1055   MCV 87.7 11/27/2015 1055   MCH 30.2 11/27/2015 1055   MCHC 34.5 11/27/2015 1055   RDW 12.9 11/27/2015 1055    A/P: 40yo G3P2012 with 8 cm L adnexal mass for removal possible cystectomy, possible LSO. Plan pelvic washings. Likley benign etiology. Pt aware R/B laprascopic surgery.  - Umbilical hernia repair by general surgery.  Saahas Hidrogo A. 12/03/2015 11:04 PM    Filed Vitals:   12/04/15 0832  BP: 115/82  Pulse: 85  Temp: 98.2 F (36.8 C)  TempSrc: Oral  Resp: 16  SpO2: 99%    Pt aware of plan for LSO vs cystectomy pending operative findings. Gen surgery ready to start case.  Baylin Gamblin  A. 12/04/2015 9:34 AM

## 2015-12-04 ENCOUNTER — Ambulatory Visit (HOSPITAL_COMMUNITY)
Admission: RE | Admit: 2015-12-04 | Discharge: 2015-12-04 | Disposition: A | Discharge status: Home / Self Care | Payer: BLUE CROSS/BLUE SHIELD | Source: Ambulatory Visit | Attending: Obstetrics | Admitting: Obstetrics

## 2015-12-04 ENCOUNTER — Ambulatory Visit (HOSPITAL_COMMUNITY): Payer: BLUE CROSS/BLUE SHIELD | Admitting: Anesthesiology

## 2015-12-04 ENCOUNTER — Encounter (HOSPITAL_COMMUNITY)
Admission: RE | Disposition: A | Discharge status: Home / Self Care | Payer: Self-pay | Source: Ambulatory Visit | Attending: Obstetrics

## 2015-12-04 ENCOUNTER — Encounter (HOSPITAL_COMMUNITY): Payer: Self-pay | Admitting: Emergency Medicine

## 2015-12-04 DIAGNOSIS — Z87442 Personal history of urinary calculi: Secondary | ICD-10-CM | POA: Insufficient documentation

## 2015-12-04 DIAGNOSIS — N838 Other noninflammatory disorders of ovary, fallopian tube and broad ligament: Secondary | ICD-10-CM | POA: Diagnosis not present

## 2015-12-04 DIAGNOSIS — N83202 Unspecified ovarian cyst, left side: Secondary | ICD-10-CM | POA: Diagnosis not present

## 2015-12-04 DIAGNOSIS — K429 Umbilical hernia without obstruction or gangrene: Secondary | ICD-10-CM | POA: Diagnosis not present

## 2015-12-04 DIAGNOSIS — Z302 Encounter for sterilization: Secondary | ICD-10-CM | POA: Diagnosis not present

## 2015-12-04 HISTORY — PX: UMBILICAL HERNIA REPAIR: SHX196

## 2015-12-04 HISTORY — PX: LAPAROSCOPY: SHX197

## 2015-12-04 LAB — PREGNANCY, URINE: PREG TEST UR: NEGATIVE

## 2015-12-04 SURGERY — LAPAROSCOPY OPERATIVE
Anesthesia: General | Site: Abdomen

## 2015-12-04 MED ORDER — PROPOFOL 10 MG/ML IV BOLUS
INTRAVENOUS | Status: DC | PRN
  Administered 2015-12-04: 150 mg via INTRAVENOUS
  Administered 2015-12-04: 30 mg via INTRAVENOUS

## 2015-12-04 MED ORDER — LACTATED RINGERS IR SOLN
Status: DC | PRN
  Administered 2015-12-04: 3000 mL

## 2015-12-04 MED ORDER — SCOPOLAMINE 1 MG/3DAYS TD PT72
MEDICATED_PATCH | TRANSDERMAL | Status: DC
Start: 2015-12-04 — End: 2015-12-04
  Administered 2015-12-04: 1.5 mg via TRANSDERMAL
  Filled 2015-12-04: qty 1

## 2015-12-04 MED ORDER — NEOSTIGMINE METHYLSULFATE 10 MG/10ML IV SOLN
INTRAVENOUS | Status: AC
  Filled 2015-12-04: qty 1

## 2015-12-04 MED ORDER — SCOPOLAMINE 1 MG/3DAYS TD PT72
1.0000 | MEDICATED_PATCH | Freq: Once | TRANSDERMAL | Status: DC
  Administered 2015-12-04: 1.5 mg via TRANSDERMAL

## 2015-12-04 MED ORDER — ROCURONIUM BROMIDE 100 MG/10ML IV SOLN
INTRAVENOUS | Status: DC | PRN
  Administered 2015-12-04: 20 mg via INTRAVENOUS
  Administered 2015-12-04: 50 mg via INTRAVENOUS

## 2015-12-04 MED ORDER — PROPOFOL 10 MG/ML IV BOLUS
INTRAVENOUS | Status: AC
  Filled 2015-12-04: qty 20

## 2015-12-04 MED ORDER — GLYCOPYRROLATE 0.2 MG/ML IJ SOLN
INTRAMUSCULAR | Status: DC | PRN
  Administered 2015-12-04: 0.2 mg via INTRAVENOUS
  Administered 2015-12-04: 0.4 mg via INTRAVENOUS

## 2015-12-04 MED ORDER — CEFAZOLIN SODIUM-DEXTROSE 2-4 GM/100ML-% IV SOLN
2.0000 g | INTRAVENOUS | Status: AC
  Administered 2015-12-04: 2 g via INTRAVENOUS

## 2015-12-04 MED ORDER — ONDANSETRON HCL 4 MG/2ML IJ SOLN
INTRAMUSCULAR | Status: DC | PRN
Start: 2015-12-04 — End: 2015-12-04
  Administered 2015-12-04: 4 mg via INTRAVENOUS

## 2015-12-04 MED ORDER — ROPIVACAINE HCL 5 MG/ML IJ SOLN
INTRAMUSCULAR | Status: AC
  Filled 2015-12-04: qty 30

## 2015-12-04 MED ORDER — MIDAZOLAM HCL 2 MG/2ML IJ SOLN
INTRAMUSCULAR | Status: DC | PRN
  Administered 2015-12-04: 2 mg via INTRAVENOUS

## 2015-12-04 MED ORDER — MIDAZOLAM HCL 2 MG/2ML IJ SOLN
INTRAMUSCULAR | Status: AC
  Filled 2015-12-04: qty 2

## 2015-12-04 MED ORDER — DEXAMETHASONE SODIUM PHOSPHATE 4 MG/ML IJ SOLN
INTRAMUSCULAR | Status: AC
  Filled 2015-12-04: qty 1

## 2015-12-04 MED ORDER — PHENYLEPHRINE HCL 10 MG/ML IJ SOLN
INTRAMUSCULAR | Status: DC | PRN
  Administered 2015-12-04 (×2): 40 ug via INTRAVENOUS

## 2015-12-04 MED ORDER — LIDOCAINE HCL (CARDIAC) 20 MG/ML IV SOLN
INTRAVENOUS | Status: DC | PRN
  Administered 2015-12-04: 60 mg via INTRAVENOUS

## 2015-12-04 MED ORDER — DEXTROSE 5 % IV SOLN: 3.0000 g | INTRAVENOUS | Status: DC

## 2015-12-04 MED ORDER — KETOROLAC TROMETHAMINE 30 MG/ML IJ SOLN
INTRAMUSCULAR | Status: AC
  Filled 2015-12-04: qty 1

## 2015-12-04 MED ORDER — KETOROLAC TROMETHAMINE 30 MG/ML IJ SOLN
INTRAMUSCULAR | Status: DC | PRN
  Administered 2015-12-04: 30 mg via INTRAVENOUS

## 2015-12-04 MED ORDER — BUPIVACAINE HCL (PF) 0.25 % IJ SOLN
INTRAMUSCULAR | Status: DC | PRN
  Administered 2015-12-04: 10 mL

## 2015-12-04 MED ORDER — HEPARIN SODIUM (PORCINE) 5000 UNIT/ML IJ SOLN
INTRAMUSCULAR | Status: AC
  Filled 2015-12-04: qty 1

## 2015-12-04 MED ORDER — OXYCODONE-ACETAMINOPHEN 5-325 MG PO TABS
1.0000 | ORAL_TABLET | ORAL | Status: DC | PRN
  Administered 2015-12-04: 1 via ORAL

## 2015-12-04 MED ORDER — BUPIVACAINE HCL (PF) 0.25 % IJ SOLN
INTRAMUSCULAR | Status: AC
Start: 2015-12-04 — End: 2015-12-04
  Filled 2015-12-04: qty 30

## 2015-12-04 MED ORDER — SODIUM CHLORIDE 0.9 % IJ SOLN
INTRAMUSCULAR | Status: AC
  Filled 2015-12-04: qty 100

## 2015-12-04 MED ORDER — ONDANSETRON HCL 4 MG/2ML IJ SOLN
INTRAMUSCULAR | Status: AC
Start: 2015-12-04 — End: 2015-12-04
  Filled 2015-12-04: qty 2

## 2015-12-04 MED ORDER — LIDOCAINE HCL (CARDIAC) 20 MG/ML IV SOLN
INTRAVENOUS | Status: AC
  Filled 2015-12-04: qty 5

## 2015-12-04 MED ORDER — VASOPRESSIN 20 UNIT/ML IV SOLN
INTRAVENOUS | Status: AC
  Filled 2015-12-04: qty 1

## 2015-12-04 MED ORDER — MENTHOL 3 MG MT LOZG
1.0000 | LOZENGE | OROMUCOSAL | Status: DC | PRN
  Administered 2015-12-04: 3 mg via ORAL
  Filled 2015-12-04: qty 9

## 2015-12-04 MED ORDER — FENTANYL CITRATE (PF) 250 MCG/5ML IJ SOLN
INTRAMUSCULAR | Status: AC
  Filled 2015-12-04: qty 5

## 2015-12-04 MED ORDER — FENTANYL CITRATE (PF) 100 MCG/2ML IJ SOLN: 25.0000 ug | INTRAMUSCULAR | Status: DC | PRN

## 2015-12-04 MED ORDER — DEXAMETHASONE SODIUM PHOSPHATE 10 MG/ML IJ SOLN
INTRAMUSCULAR | Status: DC | PRN
  Administered 2015-12-04: 4 mg via INTRAVENOUS

## 2015-12-04 MED ORDER — GLYCOPYRROLATE 0.2 MG/ML IJ SOLN
INTRAMUSCULAR | Status: AC
  Filled 2015-12-04: qty 2

## 2015-12-04 MED ORDER — FENTANYL CITRATE (PF) 100 MCG/2ML IJ SOLN
INTRAMUSCULAR | Status: DC | PRN
  Administered 2015-12-04 (×2): 100 ug via INTRAVENOUS
  Administered 2015-12-04: 50 ug via INTRAVENOUS

## 2015-12-04 MED ORDER — NEOSTIGMINE METHYLSULFATE 10 MG/10ML IV SOLN
INTRAVENOUS | Status: DC | PRN
  Administered 2015-12-04: 2.5 mg via INTRAVENOUS

## 2015-12-04 MED ORDER — OXYCODONE-ACETAMINOPHEN 5-325 MG PO TABS: 2.0000 | ORAL_TABLET | Freq: Four times a day (QID) | ORAL | Status: DC | PRN

## 2015-12-04 MED ORDER — LACTATED RINGERS IV SOLN
INTRAVENOUS | Status: DC
  Administered 2015-12-04 (×3): via INTRAVENOUS

## 2015-12-04 MED ORDER — OXYCODONE-ACETAMINOPHEN 5-325 MG PO TABS
ORAL_TABLET | ORAL | Status: AC
  Filled 2015-12-04: qty 1

## 2015-12-04 MED ORDER — ROCURONIUM BROMIDE 100 MG/10ML IV SOLN
INTRAVENOUS | Status: AC
Start: 2015-12-04 — End: 2015-12-04
  Filled 2015-12-04: qty 1

## 2015-12-04 MED ORDER — HEPARIN SODIUM (PORCINE) 5000 UNIT/ML IJ SOLN
INTRAMUSCULAR | Status: DC | PRN
  Administered 2015-12-04: 5000 [IU] via SUBCUTANEOUS

## 2015-12-04 SURGICAL SUPPLY — 63 items
BENZOIN TINCTURE PRP APPL 2/3 (GAUZE/BANDAGES/DRESSINGS) IMPLANT
BLADE SURG 10 STRL SS (BLADE) IMPLANT
BLADE SURG 15 STRL LF C SS BP (BLADE) ×4 IMPLANT
BLADE SURG 15 STRL SS (BLADE) ×4
CABLE HIGH FREQUENCY MONO STRZ (ELECTRODE) IMPLANT
CLOSURE WOUND 1/2 X4 (GAUZE/BANDAGES/DRESSINGS)
CLOTH BEACON ORANGE TIMEOUT ST (SAFETY) ×4 IMPLANT
DECANTER SPIKE VIAL GLASS SM (MISCELLANEOUS) ×8 IMPLANT
DRSG COVADERM PLUS 2X2 (GAUZE/BANDAGES/DRESSINGS) ×8 IMPLANT
DRSG OPSITE POSTOP 3X4 (GAUZE/BANDAGES/DRESSINGS) ×4 IMPLANT
DRSG TEGADERM 4X4.75 (GAUZE/BANDAGES/DRESSINGS) IMPLANT
DURAPREP 26ML APPLICATOR (WOUND CARE) ×8 IMPLANT
ELECT REM PT RETURN 9FT ADLT (ELECTROSURGICAL) ×4
ELECTRODE REM PT RTRN 9FT ADLT (ELECTROSURGICAL) ×2 IMPLANT
FORCEPS CUTTING 33CM 5MM (CUTTING FORCEPS) ×4 IMPLANT
GLOVE BIO SURGEON STRL SZ 6.5 (GLOVE) ×3 IMPLANT
GLOVE BIO SURGEONS STRL SZ 6.5 (GLOVE) ×1
GLOVE BIOGEL PI IND STRL 7.0 (GLOVE) ×4 IMPLANT
GLOVE BIOGEL PI IND STRL 8 (GLOVE) ×2 IMPLANT
GLOVE BIOGEL PI INDICATOR 7.0 (GLOVE) ×4
GLOVE BIOGEL PI INDICATOR 8 (GLOVE) ×2
GLOVE ECLIPSE 8.0 STRL XLNG CF (GLOVE) ×4 IMPLANT
GOWN STRL REUS W/ TWL LRG LVL3 (GOWN DISPOSABLE) ×4 IMPLANT
GOWN STRL REUS W/TWL LRG LVL3 (GOWN DISPOSABLE) ×16 IMPLANT
LIQUID BAND (GAUZE/BANDAGES/DRESSINGS) ×4 IMPLANT
MANIPULATOR UTERINE 4.5 ZUMI (MISCELLANEOUS) ×4 IMPLANT
NEEDLE FILTER BLUNT 18X 1/2SAF (NEEDLE) ×2
NEEDLE FILTER BLUNT 18X1 1/2 (NEEDLE) ×2 IMPLANT
NEEDLE INSUFFLATION 120MM (ENDOMECHANICALS) ×4 IMPLANT
NS IRRIG 1000ML POUR BTL (IV SOLUTION) IMPLANT
PACK LAPAROSCOPY BASIN (CUSTOM PROCEDURE TRAY) ×4 IMPLANT
PAD TRENDELENBURG POSITION (MISCELLANEOUS) ×4 IMPLANT
PENCIL BUTTON HOLSTER BLD 10FT (ELECTRODE) ×4 IMPLANT
POUCH SPECIMEN RETRIEVAL 10MM (ENDOMECHANICALS) ×4 IMPLANT
SCISSORS LAP 5X35 DISP (ENDOMECHANICALS) IMPLANT
SEALER TISSUE G2 CVD JAW 35 (ENDOMECHANICALS) IMPLANT
SEALER TISSUE G2 CVD JAW 45CM (ENDOMECHANICALS)
SET IRRIG TUBING LAPAROSCOPIC (IRRIGATION / IRRIGATOR) ×4 IMPLANT
SLEEVE SCD COMPRESS KNEE MED (MISCELLANEOUS) ×4 IMPLANT
SLEEVE XCEL OPT CAN 5 100 (ENDOMECHANICALS) ×4 IMPLANT
SOLUTION ELECTROLUBE (MISCELLANEOUS) IMPLANT
STAPLER VISISTAT 35W (STAPLE) IMPLANT
STENT BALLN UTERINE 4CM 6FR (STENTS) IMPLANT
STRIP CLOSURE SKIN 1/2X4 (GAUZE/BANDAGES/DRESSINGS) IMPLANT
SUT MNCRL AB 4-0 PS2 18 (SUTURE) ×4 IMPLANT
SUT NOVA NAB DX-16 0-1 5-0 T12 (SUTURE) ×4 IMPLANT
SUT NOVA NAB GS-22 2 0 T19 (SUTURE) IMPLANT
SUT SILK 3 0 SH 30 (SUTURE) IMPLANT
SUT VIC AB 2-0 SH 27 (SUTURE) ×2
SUT VIC AB 2-0 SH 27XBRD (SUTURE) ×2 IMPLANT
SUT VICRYL 0 UR6 27IN ABS (SUTURE) ×8 IMPLANT
SUT VICRYL 4-0 PS2 18IN ABS (SUTURE) ×8 IMPLANT
SYR 50ML LL SCALE MARK (SYRINGE) ×4 IMPLANT
SYR 5ML LL (SYRINGE) ×8 IMPLANT
SYR CONTROL 10ML LL (SYRINGE) ×4 IMPLANT
TOWEL OR 17X24 6PK STRL BLUE (TOWEL DISPOSABLE) ×12 IMPLANT
TRAY FOLEY CATH SILVER 14FR (SET/KITS/TRAYS/PACK) ×4 IMPLANT
TROCAR BALLN 12MMX100 BLUNT (TROCAR) ×4 IMPLANT
TROCAR XCEL NON-BLD 11X100MML (ENDOMECHANICALS) IMPLANT
TROCAR XCEL NON-BLD 5MMX100MML (ENDOMECHANICALS) ×4 IMPLANT
WARMER LAPAROSCOPE (MISCELLANEOUS) ×4 IMPLANT
WATER STERILE IRR 1000ML POUR (IV SOLUTION) IMPLANT
YANKAUER SUCT BULB TIP NO VENT (SUCTIONS) IMPLANT

## 2015-12-04 NOTE — Transfer of Care (Signed)
Immediate Anesthesia Transfer of Care Note  Patient: Karen Monroe  Procedure(s) Performed: Procedure(s): LAPAROSCOPY OPERATIVE WITH EXCISION OF LEFT PARATUBAL CYSTECTOMY WITH LEFT SALPINGECTOMY AND RIGHT DISTAL SALPINGECTOMY (Left) HERNIA REPAIR UMBILICAL ADULT (N/A)  Patient Location: PACU  Anesthesia Type:General  Level of Consciousness: awake, alert  and oriented  Airway & Oxygen Therapy: Patient Spontanous Breathing and Patient connected to nasal cannula oxygen  Post-op Assessment: Report given to RN and Post -op Vital signs reviewed and stable  Post vital signs: Reviewed and stable  Last Vitals:  Filed Vitals:   12/04/15 0832  BP: 115/82  Pulse: 85  Temp: 36.8 C  Resp: 16    Last Pain:  Filed Vitals:   12/04/15 0833  PainSc: 2       Patients Stated Pain Goal: 5 (89/37/34 2876)  Complications: No apparent anesthesia complications

## 2015-12-04 NOTE — H&P (Signed)
H&P   Karen Monroe (MR# [id number])      H&P Info    Author Note Status Last Update User Last Update Date/Time   Erroll Luna, MD Signed Erroll Luna, MD     H&P    Expand All Collapse All   Karen Monroe  Location: [facility] - Closed Surgery Patient #: [id number] DOB: 06-Sep-1975 Married / Language: [name] / Race: White Female  History of Present Illness  Patient words: umb hernia   Patient sent at the request of Dr. Pamala Hurry for umbilical hernia. The patient is scheduled for a laparoscopic oophorectomy by Dr. Pamala Hurry. She has a small umbilical hernia that she wants repaired in the same setting. It causes mild discomfort. She has no nausea or vomiting. She has intermittent chronic left lower quadrant abdominal pain more than likely secondary to a left ovarian cyst. Otherwise she feels well.  The patient is a 40 year old female.   Other Problems  Anxiety Disorder  Past Surgical History  Oral Surgery  Diagnostic Studies History  Colonoscopy never Mammogram never Pap Smear 1-5 years ago  Allergies  No Known Drug Allergies 09/19/2015  Medication History  Sertraline HCl (100MG Tablet, Oral) Active. Multi Vitamin (Oral) Active. Medications Reconciled  Social History  Alcohol use Occasional alcohol use. Caffeine use Carbonated beverages, Coffee, Tea. No drug use Tobacco use Never smoker.  Family History  Depression Brother. Melanoma Father.  Pregnancy / Birth History  Age at menarche 37 years. Gravida 3 Maternal age 2-30 Para 3 Regular periods     Review of Systems  General Not Present- Appetite Loss, Chills, Fatigue, Fever, Night Sweats, Weight Gain and Weight Loss. Skin Not Present- Change in Wart/Mole, Dryness, Hives, Jaundice, New Lesions, Non-Healing Wounds, Rash and Ulcer. HEENT Not Present- Earache, Hearing Loss, Hoarseness, Nose Bleed, Oral Ulcers, Ringing in the Ears, Seasonal Allergies, Sinus Pain, Sore Throat, Visual  Disturbances, Wears glasses/contact lenses and Yellow Eyes. Respiratory Not Present- Bloody sputum, Chronic Cough, Difficulty Breathing, Snoring and Wheezing. Breast Not Present- Breast Mass, Breast Pain, Nipple Discharge and Skin Changes. Cardiovascular Not Present- Chest Pain, Difficulty Breathing Lying Down, Leg Cramps, Palpitations, Rapid Heart Rate, Shortness of Breath and Swelling of Extremities. Gastrointestinal Not Present- Abdominal Pain, Bloating, Bloody Stool, Change in Bowel Habits, Chronic diarrhea, Constipation, Difficulty Swallowing, Excessive gas, Gets full quickly at meals, Hemorrhoids, Indigestion, Nausea, Rectal Pain and Vomiting. Female Genitourinary Present- Pelvic Pain. Not Present- Frequency, Nocturia, Painful Urination and Urgency. Musculoskeletal Not Present- Back Pain, Joint Pain, Joint Stiffness, Muscle Pain, Muscle Weakness and Swelling of Extremities. Neurological Not Present- Decreased Memory, Fainting, Headaches, Numbness, Seizures, Tingling, Tremor, Trouble walking and Weakness. Psychiatric Present- Anxiety. Not Present- Bipolar, Change in Sleep Pattern, Depression, Fearful and Frequent crying. Endocrine Not Present- Cold Intolerance, Excessive Hunger, Hair Changes, Heat Intolerance, Hot flashes and New Diabetes. Hematology Not Present- Easy Bruising, Excessive bleeding, Gland problems, HIV and Persistent Infections.  Vitals  09/19/2015 10:55 AM Weight: 185.25 lb Height: 62in Body Surface Area: 1.85 m Body Mass Index: 33.88 kg/m  BP: 116/78 (Sitting, Left Arm, Standard)      Physical Exam   General Mental Status-Alert. General Appearance-Consistent with stated age. Hydration-Well hydrated. Voice-Normal.  Head and Neck Head-normocephalic, atraumatic with no lesions or palpable masses. Trachea-midline. Thyroid Gland Characteristics - normal size and consistency.  Eye Eyeball - Bilateral-Extraocular movements  intact. Sclera/Conjunctiva - Bilateral-No scleral icterus.  Abdomen Note: Small reducible umbilical hernia. Soft nontender without rebound guarding or mass  Neurologic Neurologic evaluation reveals -alert and oriented x  3 with no impairment of recent or remote memory. Mental Status-Normal.  Musculoskeletal Normal Exam - Left-Upper Extremity Strength Normal and Lower Extremity Strength Normal. Normal Exam - Right-Upper Extremity Strength Normal and Lower Extremity Strength Normal.    Assessment & Plan   UMBILICAL HERNIA WITHOUT OBSTRUCTION AND WITHOUT GANGRENE (K42.9) Impression: Discuss repair with the patient. It is a small hernia and should not require mesh. The risk of hernia repair include bleeding, infection, organ injury, bowel injury, bladder injury, nerve injury recurrent hernia, blood clots, worsening of underlying condition, chronic pain, mesh use, open surgery, death, and the need for other operattions. Pt agrees to proceed  Current Plans You are being scheduled for surgery - Our schedulers will call you.  You should hear from our office's scheduling department within 5 working days about the location, date, and time of surgery. We try to make accommodations for patient's preferences in scheduling surgery, but sometimes the OR schedule or the surgeon's schedule prevents [place] from making those accommodations.  If you have not heard from our office [phone]) in 5 working days, call the office and ask for your surgeon's nurse.  If you have other questions about your diagnosis, plan, or surgery, call the office and ask for your surgeon's nurse.  The anatomy & physiology of the abdominal wall was discussed. The pathophysiology of hernias was discussed. Natural history risks without surgery including progeressive enlargement, pain, incarceration, & strangulation was discussed. Contributors to complications such as smoking, obesity, diabetes, prior surgery, etc were  discussed.  I feel the risks of no intervention will lead to serious problems that outweigh the operative risks; therefore, I recommended surgery to reduce and repair the hernia. I explained laparoscopic techniques with possible need for an open approach. I noted the probable use of mesh to patch and/or buttress the hernia repair  Risks such as bleeding, infection, abscess, need for further treatment, heart attack, death, and other risks were discussed. I noted a good likelihood this will help address the problem. Goals of post-operative recovery were discussed as well. Possibility that this will not correct all symptoms was explained. I stressed the importance of low-impact activity, aggressive pain control, avoiding constipation, & not pushing through pain to minimize risk of post-operative chronic pain or injury. Possibility of reherniation especially with smoking, obesity, diabetes, immunosuppression, and other health conditions was discussed. We will work to minimize complications.  An educational handout further explaining the pathology & treatment options was given as well. Questions were answered. The patient expresses understanding & wishes to proceed with surgery.  Pt Education - [facility] Pain Control (Gross) Pt Education - [facility] Hernia Post-Op HCI (Gross): discussed with patient and provided information.

## 2015-12-04 NOTE — Brief Op Note (Signed)
12/04/2015  11:18 AM  PATIENT:  Karen Monroe  40 y.o. female  PRE-OPERATIVE DIAGNOSIS:  Left Ovarian Cyst, Undesired Fertility  POST-OPERATIVE DIAGNOSIS:  Left Ovarian Cyst, Undesired Fertility  PROCEDURE:  Procedure(s): LAPAROSCOPY OPERATIVE (Left) HERNIA REPAIR UMBILICAL ADULT (N/A) SALPINGO OOPHORECTOMY (Left) BILATERAL TUBAL LIGATION (Bilateral)  SURGEON:  Surgeon(s) and Role: Panel 1:    * Azucena Fallen, MD - Assisting    * Aloha Gell, MD - Primary  Panel 2:    * Erroll Luna, MD - Primary     ANESTHESIA:   local and general  EBL:  Total I/O In: 1000 [I.V.:1000] Out: 200 [Urine:200]      LOCAL MEDICATIONS USED:  MARCAINE     SPECIMEN:  No Specimen  DISPOSITION OF SPECIMEN:  PATHOLOGY  COUNTS:  YES  TOURNIQUET:  * No tourniquets in log *  DICTATION: .Other Dictation: Dictation Number [id number]  PLAN OF CARE: Discharge to home after PACU  PATIENT DISPOSITION:  PACU - hemodynamically stable.   Delay start of Pharmacological VTE agent (>24hrs) due to surgical blood loss or risk of bleeding: not applicable

## 2015-12-04 NOTE — Discharge Instructions (Signed)
[facility] _______[facility]  UMBILICAL OR INGUINAL HERNIA REPAIR: POST OP INSTRUCTIONS  Always review your discharge instruction sheet given to you by the facility where your surgery was performed. IF YOU HAVE DISABILITY OR FAMILY LEAVE FORMS, YOU MUST BRING THEM TO THE OFFICE FOR PROCESSING.   DO NOT GIVE THEM TO YOUR DOCTOR.  1. A  prescription for pain medication may be given to you upon discharge.  Take your pain medication as prescribed, if needed.  If narcotic pain medicine is not needed, then you may take acetaminophen (Tylenol) or ibuprofen (Advil) as needed. 2. Take your usually prescribed medications unless otherwise directed. 3. If you need a refill on your pain medication, please contact your pharmacy.  They will contact our office to request authorization. Prescriptions will not be filled after 5 pm or on week-ends. 4. You should follow a light diet the first 24 hours after arrival home, such as soup and crackers, etc.  Be sure to include lots of fluids daily.  Resume your normal diet the day after surgery. 5. Most patients will experience some swelling and bruising around the umbilicus or in the groin and scrotum.  Ice packs and reclining will help.  Swelling and bruising can take several days to resolve.  6. It is common to experience some constipation if taking pain medication after surgery.  Increasing fluid intake and taking a stool softener (such as Colace) will usually help or prevent this problem from occurring.  A mild laxative (Milk of Magnesia or Miralax) should be taken according to package directions if there are no bowel movements after 48 hours. 7. Unless discharge instructions indicate otherwise, you may remove your bandages 24-48 hours after surgery, and you may shower at that time.  You may have steri-strips (small skin tapes) in place directly over the incision.  These strips should be left on the skin for 7-10 days.  If your surgeon used skin glue on the  incision, you may shower in 24 hours.  The glue will flake off over the next 2-3 weeks.  Any sutures or staples will be removed at the office during your follow-up visit. 8. ACTIVITIES:  You may resume regular (light) daily activities beginning the next day--such as daily self-care, walking, climbing stairs--gradually increasing activities as tolerated.  You may have sexual intercourse when it is comfortable.  Refrain from any heavy lifting or straining until approved by your doctor. a. You may drive when you are no longer taking prescription pain medication, you can comfortably wear a seatbelt, and you can safely maneuver your car and apply brakes. b. RETURN TO WORK:  __________________________________________________________ 9. You should see your doctor in the office for a follow-up appointment approximately 2-3 weeks after your surgery.  Make sure that you call for this appointment within a day or two after you arrive home to insure a convenient appointment time. 10. OTHER INSTRUCTIONS:  __________________________________________________________________________________________________________________________________________________________________________________________  WHEN TO CALL YOUR DOCTOR: 1. Fever over 101.0 2. Inability to urinate 3. Nausea and/or vomiting 4. Extreme swelling or bruising 5. Continued bleeding from incision. 6. Increased pain, redness, or drainage from the incision  The clinic staff is available to answer your questions during regular business hours.  Please dont hesitate to call and ask to speak to one of the nurses for clinical concerns.  If you have a medical emergency, go to the nearest emergency room or call 911.  A surgeon from [facility] is always on call at the hospital  [address] [place], [place], [place]  [address] ?  [address], [place], [place]   [address] [phone] ? [phone] ? FAX [phone] Web site:  [email or web address]  DISCHARGE INSTRUCTIONS: Laparoscopy  The following instructions have been prepared to help you care for yourself upon your return home today.  Wound care:  Do not get the incision wet for the first 24 hours. The incision should be kept clean and dry.  The Band-Aids or dressings may be removed the day after surgery.  Should the incision become sore, red, and swollen after the first week, check with your doctor.  Personal hygiene:  Shower the day after your procedure.  Activity and limitations:  Do NOT drive or operate any equipment today.  Do NOT lift anything more than 15 pounds for 2-3 weeks after surgery.  Do NOT rest in bed all day.  Walking is encouraged. Walk each day, starting slowly with 5-minute walks 3 or 4 times a day. Slowly increase the length of your walks.  Walk up and down stairs slowly.  Do NOT do strenuous activities, such as golfing, playing tennis, bowling, running, biking, weight lifting, gardening, mowing, or vacuuming for 2-4 weeks. Ask your doctor when it is okay to start.  Diet: Eat a light meal as desired this evening. You may resume your usual diet tomorrow.  Return to work: This is dependent on the type of work you do. For the most part you can return to a desk job within a week of surgery. If you are more active at work, please discuss this with your doctor.  What to expect after your surgery: You may have a slight burning sensation when you urinate on the first day. You may have a very small amount of blood in the urine. Expect to have a small amount of vaginal discharge/light bleeding for 1-2 weeks. It is not unusual to have abdominal soreness and bruising for up to 2 weeks. You may be tired and need more rest for about 1 week. You may experience shoulder pain for 24-72 hours. Lying flat in bed may relieve it.  Call your doctor for any of the following:  Develop a fever of 100.4 or greater  Inability to urinate 6  hours after discharge from hospital  Severe pain not relieved by pain medications  Persistent of heavy bleeding at incision site  Redness or swelling around incision site after a week  Increasing nausea or vomiting  May remove scop patch behind left ear on or before 12/07/15.  Wash hands with soap and water after contact with the patch.  May take ibuprofen after 5:50pm 12/04/15.   Patient Signature________________________________________ Nurse Signature_________________________________________

## 2015-12-04 NOTE — Brief Op Note (Signed)
12/04/2015  1:40 PM  PATIENT:  Karen Monroe  40 y.o. female  PRE-OPERATIVE DIAGNOSIS:  Left Ovarian Cyst, Undesired Fertility  POST-OPERATIVE DIAGNOSIS:  Left paratubal Cyst, Undesired Fertility  PROCEDURE:  Procedure(s): LAPAROSCOPY OPERATIVE WITH EXCISION OF LEFT PARATUBAL CYSTECTOMY WITH LEFT SALPINGECTOMY AND RIGHT DISTAL SALPINGECTOMY (Left) HERNIA REPAIR UMBILICAL ADULT (N/A)  SURGEON:  Surgeon(s) and Role: Panel 1:    * Aloha Gell, MD - Primary    * Azucena Fallen, MD - Assisting  Panel 2:    * Erroll Luna, MD - Primary  PHYSICIAN ASSISTANT: none  ASSISTANTS:  Mody, MD  ANESTHESIA:   general  EBL:  Total I/O In: 2000 [I.V.:2000] Out: 260 [Urine:250; Blood:10]  BLOOD ADMINISTERED:none  DRAINS: Urinary Catheter (Foley)   LOCAL MEDICATIONS USED:  MARCAINE     SPECIMEN:  Source of Specimen:  Left paratubal cyst, right and left fallopian tubes, pelvic washings  DISPOSITION OF SPECIMEN:  PATHOLOGY  COUNTS:  YES  TOURNIQUET:  * No tourniquets in log *  DICTATION: .Note written in EPIC  PLAN OF CARE: Discharge to home after PACU  PATIENT DISPOSITION:  PACU - hemodynamically stable.   Delay start of Pharmacological VTE agent (>24hrs) due to surgical blood loss or risk of bleeding: yes

## 2015-12-04 NOTE — Interval H&P Note (Signed)
History and Physical Interval Note:  12/04/2015 9:28 AM  Karen Monroe  has presented today for surgery, with the diagnosis of Left Ovarian Cyst  The various methods of treatment have been discussed with the patient and family. After consideration of risks, benefits and other options for treatment, the patient has consented to  Procedure(s) with comments: LAPAROSCOPY OPERATIVE (Left) - WITH LEFT SALPINGOOPHERECTOMY HERNIA REPAIR UMBILICAL ADULT (N/A) as a surgical intervention .  The patient's history has been reviewed, patient examined, no change in status, stable for surgery.  I have reviewed the patient's chart and labs.  Questions were answered to the patient's satisfaction.     Giannie Soliday A.

## 2015-12-04 NOTE — Anesthesia Procedure Notes (Signed)
Procedure Name: Intubation Date/Time: 12/04/2015 10:13 AM Performed by: Jonna Munro Pre-anesthesia Checklist: Patient identified, Emergency Drugs available, Suction available, Patient being monitored and Timeout performed Patient Re-evaluated:Patient Re-evaluated prior to inductionOxygen Delivery Method: Circle system utilized Preoxygenation: Pre-oxygenation with 100% oxygen Intubation Type: IV induction Ventilation: Mask ventilation without difficulty Laryngoscope Size: Mac and 3 Grade View: Grade II Tube type: Oral Tube size: 7.0 mm Number of attempts: 3 Airway Equipment and Method: Stylet Placement Confirmation: ETT inserted through vocal cords under direct vision,  positive ETCO2 and breath sounds checked- equal and bilateral Secured at: 22 cm Tube secured with: Tape Dental Injury: Teeth and Oropharynx as per pre-operative assessment  Comments: DL x 1 by CRNA with MAC 4, unable to visualize cords, changed to MAC 3 DL by CRNA unable to see cords, DL by Dr. Gifford Shave, atraumatic intubation, =bilat, breath sounds, +ETCO2.

## 2015-12-04 NOTE — Op Note (Signed)
12/04/2015  1:40 PM  PATIENT:  Karen Monroe  40 y.o. female  PRE-OPERATIVE DIAGNOSIS:  Left Ovarian Cyst, Undesired Fertility  POST-OPERATIVE DIAGNOSIS:  Left paratubal Cyst, Undesired Fertility  PROCEDURE:  Procedure(s): LAPAROSCOPY OPERATIVE WITH EXCISION OF LEFT PARATUBAL CYSTECTOMY WITH LEFT SALPINGECTOMY AND RIGHT DISTAL SALPINGECTOMY (Left) HERNIA REPAIR UMBILICAL ADULT (N/A)  SURGEON:  Surgeon(s) and Role: Panel 1:    * Aloha Gell, MD - Primary    * Azucena Fallen, MD - Assisting  Panel 2:    * Erroll Luna, MD - Primary  PHYSICIAN ASSISTANT: none  ASSISTANTS:  Mody, MD  ANESTHESIA:   general  EBL:  Total I/O In: 2000 [I.V.:2000] Out: 260 [Urine:250; Blood:10]  BLOOD ADMINISTERED:none  DRAINS: Urinary Catheter (Foley)   LOCAL MEDICATIONS USED:  MARCAINE     SPECIMEN:  Source of Specimen:  Left paratubal cyst, right and left fallopian tubes, pelvic washings  DISPOSITION OF SPECIMEN:  PATHOLOGY  COUNTS:  YES  TOURNIQUET:  * No tourniquets in log *  DICTATION: .Note written in EPIC  PLAN OF CARE: Discharge to home after PACU  PATIENT DISPOSITION:  PACU - hemodynamically stable.   Delay start of Pharmacological VTE agent (>24hrs) due to surgical blood loss or risk of bleeding: yes  Findings: Normal uterus, normal bilateral ovaries, normal right fallopian tube, left ovarian tube torsed with small bowel loosely caught in the torsion. 8 cm simple paratubal cyst as the source of the torsion. Normal liver and gallbladder.  Indications: This is a 40 year old G4 P3 nonpregnant patient with abdominal pain, 8 cm left adnexal cyst, undesired fertility and umbilical hernia. Given persistent abdominal pain patient was admitted for laparoscopic removal of left adnexal cyst and salpingectomy. Gen. surgery is also planning umbilical hernia repair.  Procedure: After informed consent including discussion of risks of bleeding, infection, failure to cure her pain, the  patient was taken to the operating room where general anesthesia was initiated without difficulty. She was prepped and draped in normal sterile fashion in the dorsal supine lithotomy position. A Foley catheter was inserted sterilely into the bladder. A bimanual examination was done to assess the size and position of the uterus. The speculum was inserted into the vagina. A single-tooth tenaculum was used to grasp the anterior lip of the cervix. The cervix was dilated to a #21 Pratt  dilator. A ZUMI uterine manipulator was easily inserted into the uterus and the intrauterine balloon was inflated.  Attention was then turned to the patient's abdomen.  A Trocar Was in Place at the Umbilicus Incision with a Pursestring Suture Tied around This. This Was Done by the General Surgeon after Dissection and Removal of the Umbilical Hernia. 0.5 % marcaine was used prior to all incision. A total of 10 cc of marcaine was used. The laparoscope was placed through the umbilical port and brief survey of the abdomen and pelvis was done with findings as above. The abdominal wall was assessed and the right and left lower quadrant port sites were marked. 5 mm incisions were placed in the right and left lower quadrants. Non-bladed ports were placed under direct visualization.   The uterus was found to be normal. The bilateral ovaries were found to be normal the right fallopian tube was found to be normal. The large simple cyst was manipulated and was found to be a paratubal cyst that was torsed around the fallopian tube. This was untwisted and the bowel without was caught in the wrapped tube was freed and released. This was  done bluntly. Pelvic washings were obtained. Once identifying the IP ligament the gyrus bipolar cautery was used to come across the mesosalpinx with cautery then sharp transection. This was done along the length of the mesosalpinx until the proximal edge of the mesoalpinx at the border of the uterus. The tube was  then freed. It fell into the left upper quadrant due to Trendelenburg position. Pedicles were reevaluated found to be hemostatic and the gyrus bipolar cautery was used to assure additional hemostasis along the edge of the mesosapinx near the IP ligament. Assessment of the right adnexa was done and given the patient's desire for no further fertility decision was made to proceed with a distal right salpingectomy. Using blunt and grasping instruments to elevate the tube off of the mesial salpinx the bipolar cautery was used to come across the mesosalpinx starting at the fimbriated and and carrying through just past the mid isthmic portion. The tube was transected free and removed from the umbilical port. Hemostasis was noted.  The camera was changed to a 5 mm camera and placed through the left lower quadrant port and a Endo Catch bag was placed through the umbilicus port. However the 8 cm cystic structure was too large to fit in the Endo Catch bag. The Endo Catch bag was removed the freed cyst was brought to the umbilicus incision after the umbilical port was removed. A Irvine Glorioso clamp was used to grasp the tubal portion and the cyst was pulled up through the umbilicus incision. Using an 18-gauge needle on a large syringe the cyst was decompressed. Approximately 240 cc of clear fluid was removed from the cyst before the cyst was able to be pulled through the umbilicus incision. Care was done to ensure no spill of cyst fluid into the belly. This was done with the use of the needle and syringe and using gauze pads to immediately soak up any cyst fluid that spilled while we were changing the syringe. After the left adnexal cyst with attached fallopian tube was removed repeat laparoscopy was done to assure hemostasis. Pedicles were irrigated and free fluid was removed. Laparoscopic instruments were then removed.  Patient was taken out of Trendelenburg position the pursestring suture at the umbilicus that had been sewn by  the general surgeon was closed with care taken to ensure no omental structures were caught in the closure. The midpoint of the umbilicus was sutured deep to the fascia to re-create the appearance of a umbilicus. The skin was closed with 4-0 Vicryl on a curved needle. The right and left lower quadrant ports were closed with Dermabond given their small size.  Vaginal  instruments were removed hemostasis was noted.  The patient was woken from general anesthesia sponge lap and needle counts were correct 3 and patient was taken to the recovery room in stable condition  Jabin Tapp A. 12/04/2015 1:53 PM

## 2015-12-04 NOTE — Anesthesia Preprocedure Evaluation (Addendum)
Anesthesia Evaluation  Patient identified by MRN, date of birth, ID band Patient awake    Reviewed: Allergy & Precautions, NPO status , Patient's Chart, lab work & pertinent test results  Airway Mallampati: II  TM Distance: >3 FB Neck ROM: Full    Dental  (+) Teeth Intact, Dental Advisory Given   Pulmonary neg pulmonary ROS,    Pulmonary exam normal breath sounds clear to auscultation       Cardiovascular Exercise Tolerance: Good negative cardio ROS Normal cardiovascular exam Rhythm:Regular Rate:Normal     Neuro/Psych PSYCHIATRIC DISORDERS Anxiety Depression negative neurological ROS     GI/Hepatic negative GI ROS, Neg liver ROS,   Endo/Other  negative endocrine ROS  Renal/GU negative Renal ROS     Musculoskeletal negative musculoskeletal ROS (+)   Abdominal   Peds  Hematology negative hematology ROS (+)   Anesthesia Other Findings Day of surgery medications reviewed with the patient.  Reproductive/Obstetrics negative OB ROS                             Anesthesia Physical Anesthesia Plan  ASA: II  Anesthesia Plan: General   Post-op Pain Management:    Induction: Intravenous  Airway Management Planned: Oral ETT  Additional Equipment:   Intra-op Plan:   Post-operative Plan: Extubation in OR  Informed Consent: I have reviewed the patients History and Physical, chart, labs and discussed the procedure including the risks, benefits and alternatives for the proposed anesthesia with the patient or authorized representative who has indicated his/her understanding and acceptance.   Dental advisory given  Plan Discussed with: CRNA  Anesthesia Plan Comments: (Risks/benefits of general anesthesia discussed with patient including risk of damage to teeth, lips, gum, and tongue, nausea/vomiting, allergic reactions to medications, and the possibility of heart attack, stroke and death.  All  patient questions answered.  Patient wishes to proceed.)        Anesthesia Quick Evaluation

## 2015-12-05 ENCOUNTER — Encounter (HOSPITAL_COMMUNITY): Payer: Self-pay | Admitting: Obstetrics

## 2015-12-05 NOTE — Op Note (Signed)
NAMEEMRI, SAMPLE NO.:  [id number]  MEDICAL RECORD NO.:  [id number]  LOCATION:  [facility]                          FACILITY:  [facility]  PHYSICIAN:  Tiyon Sanor A. Lagina Reader, M.D.DATE OF BIRTH:  03/29/1976  DATE OF PROCEDURE:  12/04/2015 DATE OF DISCHARGE:  12/04/2015                              OPERATIVE REPORT   PREOPERATIVE DIAGNOSIS:  Umbilical hernia.  POSTOPERATIVE DIAGNOSIS:  Umbilical hernia.  PROCEDURE:  Primary repair of umbilical hernia.  SURGEON:  Marcello Moores A. Chandel Zaun, M.D.  ANESTHESIA:  General with 0.25% Sensorcaine plain.  EBL:  Minimal.  SPECIMENS:  None.  INDICATIONS FOR PROCEDURE:  The patient is a 40 year old female, undergoing a left salpingo-oophorectomy.  She has a small umbilical hernia 1-6 at the same time.  I have met with the patient preoperatively and discussed the procedure with her.  This is a very small hernia and did not recommend mesh for repair.  I discussed the fact that this may be a port site for her gynecologist and I would work with gynecologist to help utilize this hernia initially as a port sites and then facilitate closure of her hernia primarily.  Risk of bleeding, infection, recurrence, bowel injury, bladder injury, the need for revisional surgery, cosmetic deformity, drainage, pain, slow wound healing, were all discussed preoperatively with the patient.  I met with her gynecologist as well.  We discussed that procedure that I would provide exposure, placed the ports and then have a suture to close the hernia when she was done with her oophorectomy.  I met with Dr. Pamala Hurry preoperatively and we discussed her plan of action and we were both in agreement.  DESCRIPTION OF PROCEDURE:  The patient was met in the holding area.  I met with she and her husband, discussed the procedure.  She was taken back to the operating room and placed supine.  After sterile prep and drape, time-out was done.  Curvilinear incision was made  after infiltration of local anesthetic into the skin at the base of the umbilicus.  Curvilinear incision was made and the umbilicus was dissected off the hernia sac.  The defect was identified.  It was a 5-mm defect.  I made this defect a centimeter so that it would facilitate a port for her oophorectomy.  A pursestring suture of #1 Novafil was placed circumferentially.  I then placed the port into the abdominal cavity without difficulty insufflating the abdominal cavity to 15 mmHg pressure.  A laparoscope was placed and I saw no evidence of injury to the internal viscera with placement of this port site.  There was no evidence of any bleeding.  There was no evidence of any bowel injury at this point in time.  At this point in time, Dr. Pamala Hurry scrubbed in to complete the procedure.  Pursestring suture was placed.  We discussed closure of the suture when she was done and this closed the hernia adequately.  I dissected all hernia sac out of the hernia itself and of note, she has small piece of preperitoneal fat within the hernia.  The patient was hemodynamically stable.  Laparoscopy again was performed and I saw the large cyst in her pelvis, but  no other abnormality of node. The small intestine looked normal as well as the colon, liver, stomach and the remainder of her internal viscera that I could visualize.  She was stable at this point in time.  Please see Dr. Jerrilyn Cairo note for completion of her part of the case.     Krishna Dancel A. Vinal Rosengrant, M.D.     TAC/MEDQ  D:  12/04/2015  T:  12/05/2015  Job:  [id number]

## 2015-12-08 NOTE — Anesthesia Postprocedure Evaluation (Signed)
Anesthesia Post Note  Patient: Khilynn Borntreger  Procedure(s) Performed: Procedure(s) (LRB): LAPAROSCOPY OPERATIVE WITH EXCISION OF LEFT PARATUBAL CYSTECTOMY WITH LEFT SALPINGECTOMY AND RIGHT DISTAL SALPINGECTOMY (Left) HERNIA REPAIR UMBILICAL ADULT (N/A)  Patient location during evaluation: PACU Anesthesia Type: General Level of consciousness: awake and alert Pain management: pain level controlled Vital Signs Assessment: post-procedure vital signs reviewed and stable Respiratory status: spontaneous breathing, nonlabored ventilation, respiratory function stable and patient connected to nasal cannula oxygen Cardiovascular status: blood pressure returned to baseline and stable Postop Assessment: no signs of nausea or vomiting Anesthetic complications: no    Last Vitals:  Filed Vitals:   12/04/15 1400 12/04/15 1500  BP: 93/55 101/65  Pulse: 98 91  Temp: 36.8 C 36.9 C  Resp: 16 16    Last Pain:  Filed Vitals:   12/05/15 1347  PainSc: 2                  Catalina Gravel

## 2016-02-19 DIAGNOSIS — L659 Nonscarring hair loss, unspecified: Secondary | ICD-10-CM | POA: Diagnosis not present

## 2016-02-19 DIAGNOSIS — Z0000 Encounter for general adult medical examination without abnormal findings: Secondary | ICD-10-CM | POA: Diagnosis not present

## 2016-02-24 DIAGNOSIS — D2239 Melanocytic nevi of other parts of face: Secondary | ICD-10-CM | POA: Diagnosis not present

## 2016-02-24 DIAGNOSIS — L720 Epidermal cyst: Secondary | ICD-10-CM | POA: Diagnosis not present

## 2016-02-24 DIAGNOSIS — D225 Melanocytic nevi of trunk: Secondary | ICD-10-CM | POA: Diagnosis not present

## 2016-02-24 DIAGNOSIS — L814 Other melanin hyperpigmentation: Secondary | ICD-10-CM | POA: Diagnosis not present

## 2016-02-24 DIAGNOSIS — D2261 Melanocytic nevi of right upper limb, including shoulder: Secondary | ICD-10-CM | POA: Diagnosis not present

## 2016-02-24 DIAGNOSIS — L570 Actinic keratosis: Secondary | ICD-10-CM | POA: Diagnosis not present

## 2016-06-08 DIAGNOSIS — L700 Acne vulgaris: Secondary | ICD-10-CM | POA: Diagnosis not present

## 2016-06-30 IMAGING — US US ART/VEN ABD/PELV/SCROTUM DOPPLER LTD
1 series · 13 of 25 positions shown · non-contrast
Comparison: Pelvic ultrasound 06/21/2012.  CT scan earlier today.

CLINICAL DATA: Large adnexal cyst on CT scan earlier today. Left
flank pain beginning at 8 a.m. this morning. Initial encounter.

EXAM:
TRANSABDOMINAL AND TRANSVAGINAL ULTRASOUND OF PELVIS
DOPPLER ULTRASOUND OF OVARIES
TECHNIQUE: Both transabdominal and transvaginal ultrasound examinations of the
pelvis were performed. Transabdominal technique was performed for
global imaging of the pelvis including uterus, ovaries, adnexal
regions, and pelvic cul-de-sac.
It was necessary to proceed with endovaginal exam following the
transabdominal exam to visualize the adnexa. Color and duplex
Doppler ultrasound was utilized to evaluate blood flow to the
ovaries.

[Series 1: us art/ven abd/pelv/scrotum doppler ltd · 0.24mm/px · 13 of 70 slices shown]
[im 1/70]
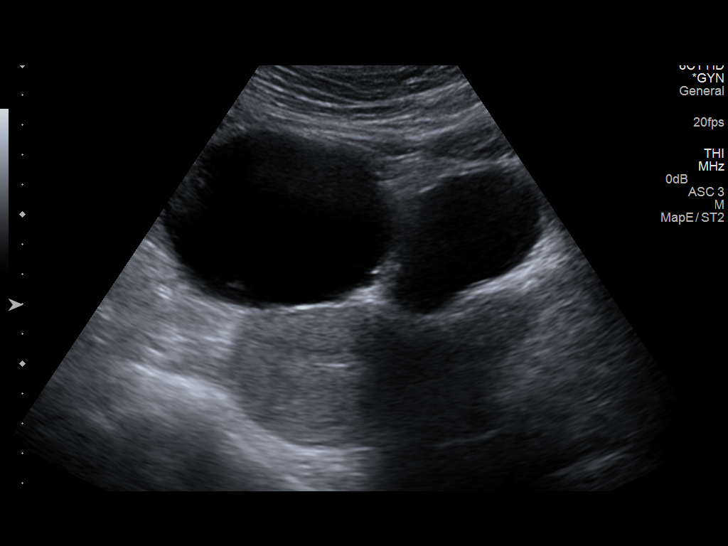
[im 6/70]
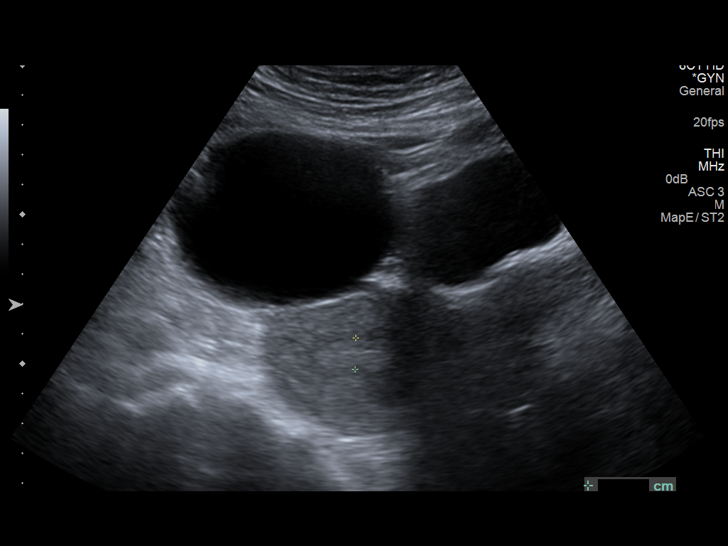
[im 12/70]
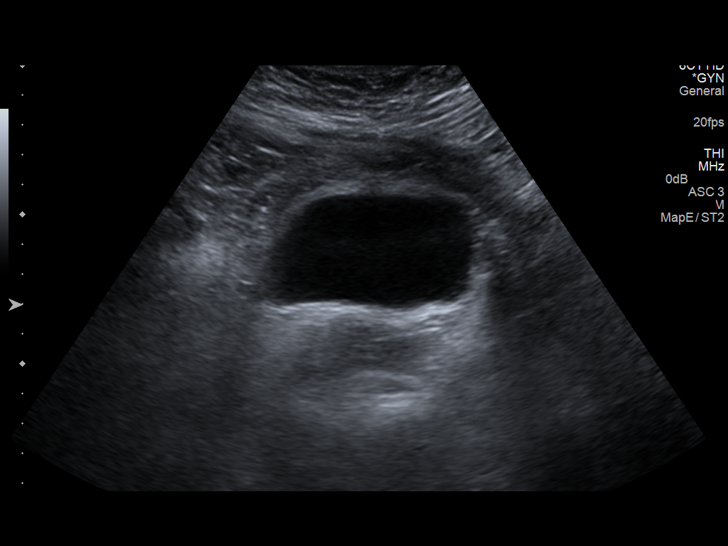
[im 18/70]
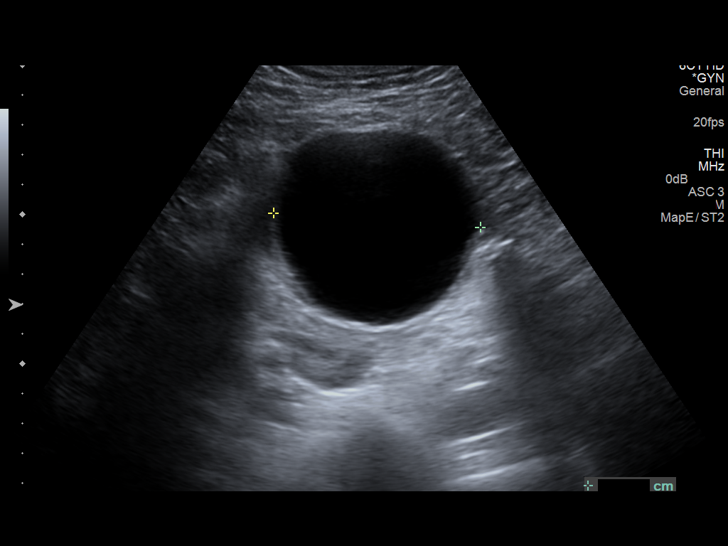
[im 24/70]
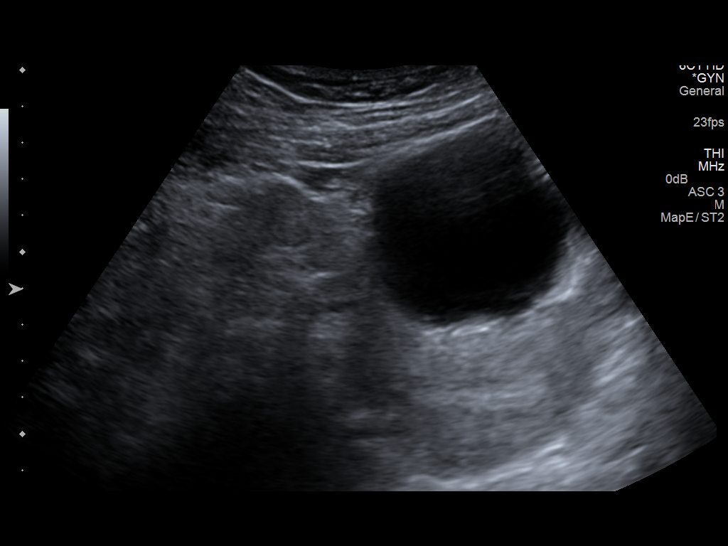
[im 29/70]
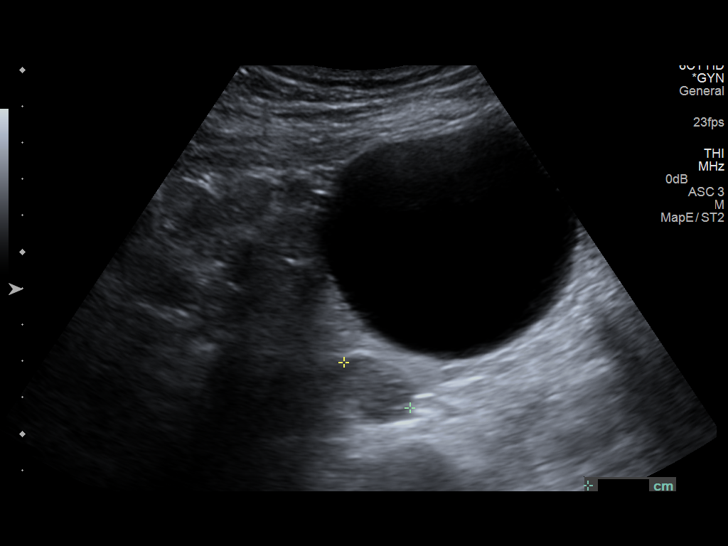
[im 35/70]
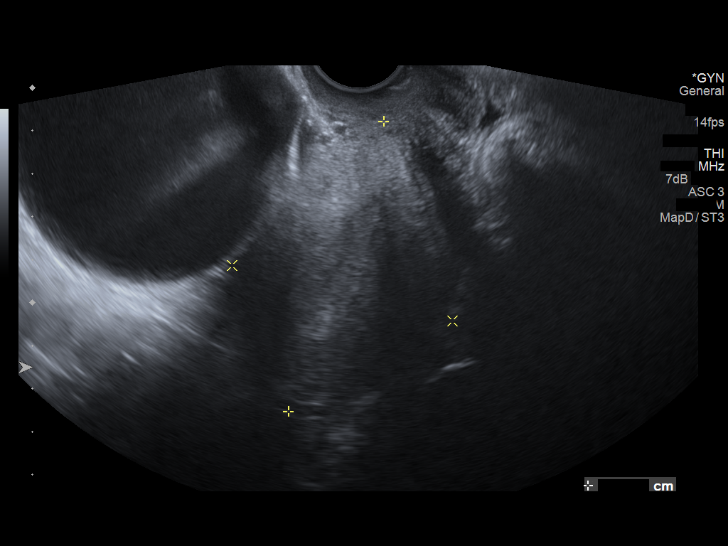
[im 41/70]
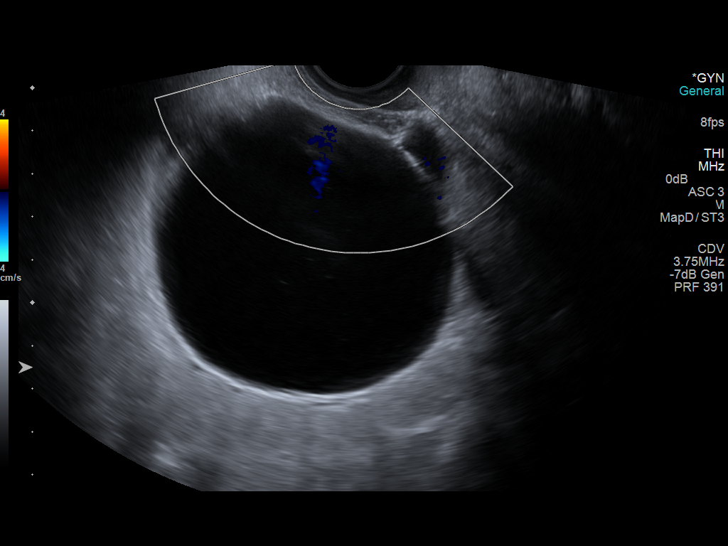
[im 47/70]
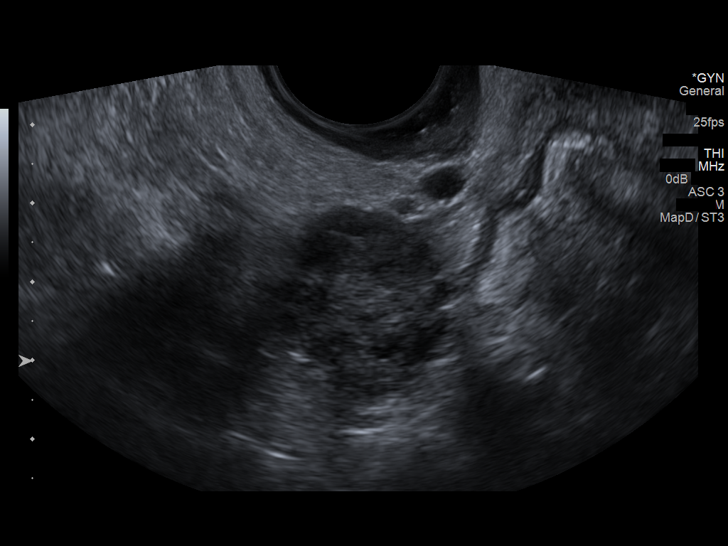
[im 52/70]
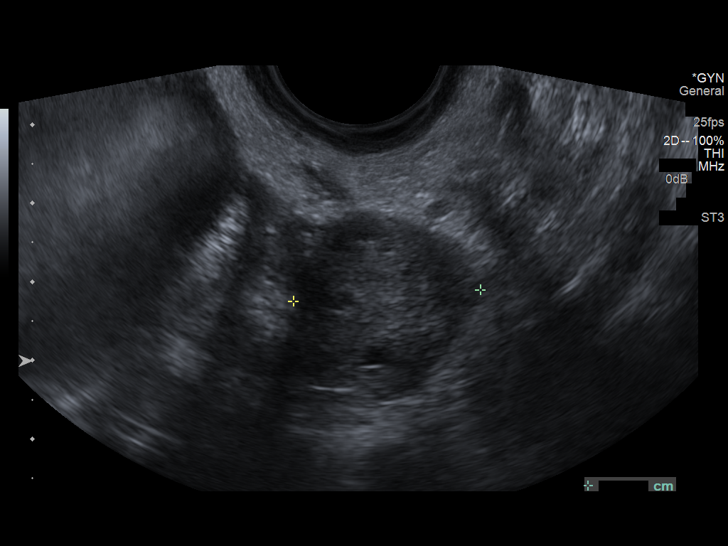
[im 58/70]
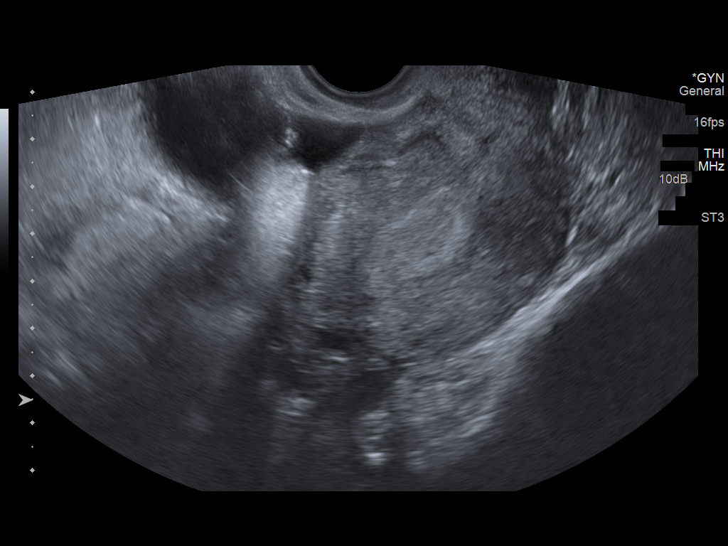
[im 64/70]
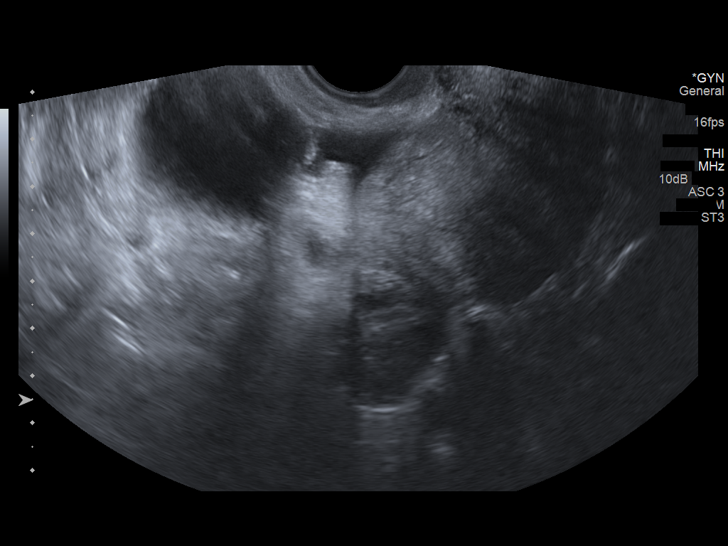
[im 70/70]
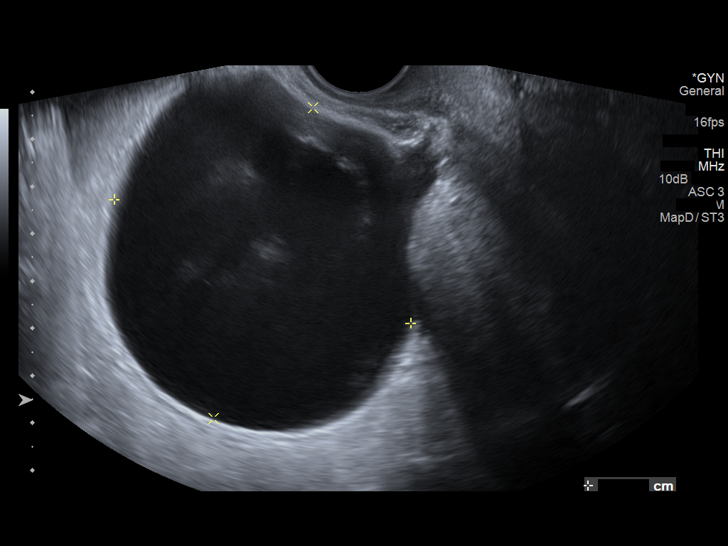

[13 of 25 positions shown; findings below may reference images not displayed]

FINDINGS: Uterus

Measurements: 9.2 x 4.9 x 6.2 cm. No fibroids or other mass
visualized.

Endometrium

Thickness: 12 mm.  No focal abnormality visualized.

Right ovary

Measurements: 2.5 x 1.8 x 2.0 cm. Normal appearance/no adnexal mass.
No hydrosalpinx.

Left ovary

Measurements: 2.2 x 2.4 x 2.4 cm. There is a cyst anterior to the
uterus which is simple and appears to emanate from the left ovary
based on correlation with CT scan and this study. The lesion
measures measures 7.3 x 6.8 x 7.8 cm. No hydrosalpinx.

Pulsed Doppler evaluation of both ovaries demonstrates normal
low-resistance arterial and venous waveforms.

Other findings

No free pelvic fluid.
IMPRESSION: Large simple appearing cystic lesion appears to emanate from the
left ovary and could be an exophytic or paraovarian cyst. Follow-up
ultrasound following the patient's next menstrual period is
recommended. If this is an exophytic cystic, it may place the
patient at increased risk for ovarian torsion. There is no evidence
of torsion on today's study.

## 2016-09-02 DIAGNOSIS — L700 Acne vulgaris: Secondary | ICD-10-CM | POA: Diagnosis not present

## 2017-05-25 DIAGNOSIS — R635 Abnormal weight gain: Secondary | ICD-10-CM | POA: Diagnosis not present

## 2017-05-25 DIAGNOSIS — E669 Obesity, unspecified: Secondary | ICD-10-CM | POA: Diagnosis not present

## 2017-05-25 DIAGNOSIS — F411 Generalized anxiety disorder: Secondary | ICD-10-CM | POA: Diagnosis not present

## 2017-06-03 DIAGNOSIS — R42 Dizziness and giddiness: Secondary | ICD-10-CM | POA: Diagnosis not present

## 2017-06-03 DIAGNOSIS — H8110 Benign paroxysmal vertigo, unspecified ear: Secondary | ICD-10-CM | POA: Diagnosis not present

## 2017-09-27 DIAGNOSIS — L718 Other rosacea: Secondary | ICD-10-CM | POA: Diagnosis not present

## 2017-09-27 DIAGNOSIS — L249 Irritant contact dermatitis, unspecified cause: Secondary | ICD-10-CM | POA: Diagnosis not present

## 2017-11-17 DIAGNOSIS — D225 Melanocytic nevi of trunk: Secondary | ICD-10-CM | POA: Diagnosis not present

## 2017-11-17 DIAGNOSIS — D2262 Melanocytic nevi of left upper limb, including shoulder: Secondary | ICD-10-CM | POA: Diagnosis not present

## 2017-11-17 DIAGNOSIS — L738 Other specified follicular disorders: Secondary | ICD-10-CM | POA: Diagnosis not present

## 2017-11-17 DIAGNOSIS — D2261 Melanocytic nevi of right upper limb, including shoulder: Secondary | ICD-10-CM | POA: Diagnosis not present

## 2017-11-24 DIAGNOSIS — E669 Obesity, unspecified: Secondary | ICD-10-CM | POA: Diagnosis not present

## 2017-11-24 DIAGNOSIS — F411 Generalized anxiety disorder: Secondary | ICD-10-CM | POA: Diagnosis not present

## 2018-01-13 DIAGNOSIS — Z6835 Body mass index (BMI) 35.0-35.9, adult: Secondary | ICD-10-CM | POA: Diagnosis not present

## 2018-01-13 DIAGNOSIS — Z01419 Encounter for gynecological examination (general) (routine) without abnormal findings: Secondary | ICD-10-CM | POA: Diagnosis not present

## 2018-01-13 DIAGNOSIS — Z1151 Encounter for screening for human papillomavirus (HPV): Secondary | ICD-10-CM | POA: Diagnosis not present

## 2018-01-13 DIAGNOSIS — Z1231 Encounter for screening mammogram for malignant neoplasm of breast: Secondary | ICD-10-CM | POA: Diagnosis not present

## 2018-02-06 DIAGNOSIS — Z6835 Body mass index (BMI) 35.0-35.9, adult: Secondary | ICD-10-CM | POA: Diagnosis not present

## 2018-02-06 DIAGNOSIS — Z713 Dietary counseling and surveillance: Secondary | ICD-10-CM | POA: Diagnosis not present

## 2018-04-08 ENCOUNTER — Other Ambulatory Visit: Payer: Self-pay

## 2018-04-08 ENCOUNTER — Ambulatory Visit (INDEPENDENT_AMBULATORY_CARE_PROVIDER_SITE_OTHER): Payer: BLUE CROSS/BLUE SHIELD

## 2018-04-08 ENCOUNTER — Encounter: Payer: Self-pay | Admitting: Sports Medicine

## 2018-04-08 ENCOUNTER — Ambulatory Visit: Payer: BLUE CROSS/BLUE SHIELD | Admitting: Sports Medicine

## 2018-04-08 ENCOUNTER — Other Ambulatory Visit: Payer: Self-pay | Admitting: Sports Medicine

## 2018-04-08 VITALS — BP 113/79 | HR 108 | Resp 16 | Ht 62.0 in | Wt 190.0 lb

## 2018-04-08 DIAGNOSIS — M779 Enthesopathy, unspecified: Secondary | ICD-10-CM

## 2018-04-08 DIAGNOSIS — M79671 Pain in right foot: Secondary | ICD-10-CM

## 2018-04-08 MED ORDER — MELOXICAM 15 MG PO TABS: 15.0000 mg | ORAL_TABLET | Freq: Every day | ORAL | 0 refills | Status: DC

## 2018-04-08 MED ORDER — METHYLPREDNISOLONE 4 MG PO TBPK: ORAL_TABLET | ORAL | 0 refills | Status: AC

## 2018-04-08 MED ORDER — MELOXICAM 15 MG PO TABS: 15.0000 mg | ORAL_TABLET | Freq: Every day | ORAL | 0 refills | Status: AC

## 2018-04-08 NOTE — Progress Notes (Signed)
   Subjective:    Patient ID: Karen Monroe, female    DOB: Jan 29, 1976, 42 y.o.   MRN: [id number]  HPI    Review of Systems  All other systems reviewed and are negative.      Objective:   Physical Exam        Assessment & Plan:

## 2018-04-08 NOTE — Progress Notes (Signed)
Subjective: Karen Monroe is a 42 y.o. female patient who presents to office for evaluation of right foot pain. Patient complains of progressive pain especially over the last year at the side of her right foot that sometimes radiates on the top and reports that with she pushes down on her first metatarsal can feel pain on the lateral side of the foot.  Ranks pain 5/10 and is now interferring with daily activities at the end of day with throbbing states that she has tried ibuprofen and states that this is not concerning because it is throbbing in the end of her day and is affected the way that she walks. Patient denies any other pedal complaints. Denies injury/trip/fall/sprain/any causative factors.   Patient Active Problem List   Diagnosis Date Noted  . SVD (spontaneous vaginal delivery) 02/26/2013  . Postpartum care following vaginal delivery (8/11) 02/26/2013    Current Outpatient Medications on File Prior to Visit  Medication Sig Dispense Refill  . FIBER SELECT GUMMIES PO Take 2 each by mouth daily as needed (For constipation.).    Marland Kitchen ibuprofen (ADVIL,MOTRIN) 200 MG tablet Take 400 mg by mouth every 6 (six) hours as needed for headache or mild pain.     Marland Kitchen oxyCODONE-acetaminophen (ROXICET) 5-325 MG tablet Take 2 tablets by mouth every 6 (six) hours as needed for severe pain. 30 tablet 0  . sertraline (ZOLOFT) 100 MG tablet Take 100 mg by mouth every morning.   1   No current facility-administered medications on file prior to visit.     Allergies  Allergen Reactions  . Bee Venom Anaphylaxis    Objective:  General: Alert and oriented x3 in no acute distress  Dermatology: No open lesions bilateral lower extremities, no webspace macerations, no ecchymosis bilateral, all nails x 10 are well manicured.  Vascular: Dorsalis Pedis and Posterior Tibial pedal pulses palpable, Capillary Fill Time 3 seconds,(+) pedal hair growth bilateral, no edema bilateral lower extremities, Temperature gradient  within normal limits.  Neurology: Johney Maine sensation intact via light touch bilateral.  Musculoskeletal: Mild tenderness with palpation peroneal tendon course and dorsal lateral foot along the extensor digitorum longus to the fourth metatarsal and toe,No pain with calf compression bilateral.  No pain with tuning fork to area.  Strength within normal limits in all groups bilateral.   Gait: Minimally antalgic gait  Xrays  Right foot   Impression: Normal osseous mineralization, there is mild fifth hammertoe with bunion no other acute findings noted.  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  Advised patient if pain continues may benefit from steroid injection and custom insoles.  Landis Martins, DPM

## 2018-07-23 DIAGNOSIS — R509 Fever, unspecified: Secondary | ICD-10-CM | POA: Diagnosis not present

## 2018-07-23 DIAGNOSIS — J111 Influenza due to unidentified influenza virus with other respiratory manifestations: Secondary | ICD-10-CM | POA: Diagnosis not present

## 2018-07-23 DIAGNOSIS — R52 Pain, unspecified: Secondary | ICD-10-CM | POA: Diagnosis not present

## 2018-07-28 DIAGNOSIS — R635 Abnormal weight gain: Secondary | ICD-10-CM | POA: Diagnosis not present

## 2018-07-28 DIAGNOSIS — F411 Generalized anxiety disorder: Secondary | ICD-10-CM | POA: Diagnosis not present

## 2018-09-14 DIAGNOSIS — F411 Generalized anxiety disorder: Secondary | ICD-10-CM | POA: Diagnosis not present

## 2018-09-14 DIAGNOSIS — R635 Abnormal weight gain: Secondary | ICD-10-CM | POA: Diagnosis not present

## 2018-11-07 DIAGNOSIS — F411 Generalized anxiety disorder: Secondary | ICD-10-CM | POA: Diagnosis not present

## 2019-02-19 DIAGNOSIS — Z0000 Encounter for general adult medical examination without abnormal findings: Secondary | ICD-10-CM | POA: Diagnosis not present

## 2019-02-19 DIAGNOSIS — F411 Generalized anxiety disorder: Secondary | ICD-10-CM | POA: Diagnosis not present

## 2019-02-19 DIAGNOSIS — Z13220 Encounter for screening for lipoid disorders: Secondary | ICD-10-CM | POA: Diagnosis not present

## 2019-02-19 DIAGNOSIS — E669 Obesity, unspecified: Secondary | ICD-10-CM | POA: Diagnosis not present

## 2019-04-09 DIAGNOSIS — Z6838 Body mass index (BMI) 38.0-38.9, adult: Secondary | ICD-10-CM | POA: Diagnosis not present

## 2019-04-09 DIAGNOSIS — Z23 Encounter for immunization: Secondary | ICD-10-CM | POA: Diagnosis not present

## 2019-04-09 DIAGNOSIS — Z1231 Encounter for screening mammogram for malignant neoplasm of breast: Secondary | ICD-10-CM | POA: Diagnosis not present

## 2019-04-09 DIAGNOSIS — Z01419 Encounter for gynecological examination (general) (routine) without abnormal findings: Secondary | ICD-10-CM | POA: Diagnosis not present

## 2019-05-22 DIAGNOSIS — F411 Generalized anxiety disorder: Secondary | ICD-10-CM | POA: Diagnosis not present

## 2019-05-22 DIAGNOSIS — F900 Attention-deficit hyperactivity disorder, predominantly inattentive type: Secondary | ICD-10-CM | POA: Diagnosis not present

## 2019-11-21 DIAGNOSIS — L814 Other melanin hyperpigmentation: Secondary | ICD-10-CM | POA: Diagnosis not present

## 2019-11-21 DIAGNOSIS — D225 Melanocytic nevi of trunk: Secondary | ICD-10-CM | POA: Diagnosis not present

## 2019-11-21 DIAGNOSIS — L858 Other specified epidermal thickening: Secondary | ICD-10-CM | POA: Diagnosis not present

## 2019-11-21 DIAGNOSIS — D2262 Melanocytic nevi of left upper limb, including shoulder: Secondary | ICD-10-CM | POA: Diagnosis not present

## 2020-02-20 DIAGNOSIS — F411 Generalized anxiety disorder: Secondary | ICD-10-CM | POA: Diagnosis not present

## 2020-02-20 DIAGNOSIS — F900 Attention-deficit hyperactivity disorder, predominantly inattentive type: Secondary | ICD-10-CM | POA: Diagnosis not present

## 2020-03-17 DIAGNOSIS — F4323 Adjustment disorder with mixed anxiety and depressed mood: Secondary | ICD-10-CM | POA: Diagnosis not present

## 2020-03-28 DIAGNOSIS — F4323 Adjustment disorder with mixed anxiety and depressed mood: Secondary | ICD-10-CM | POA: Diagnosis not present

## 2020-04-02 DIAGNOSIS — Z03818 Encounter for observation for suspected exposure to other biological agents ruled out: Secondary | ICD-10-CM | POA: Diagnosis not present

## 2020-05-08 DIAGNOSIS — F4323 Adjustment disorder with mixed anxiety and depressed mood: Secondary | ICD-10-CM | POA: Diagnosis not present

## 2020-05-21 DIAGNOSIS — Z20822 Contact with and (suspected) exposure to covid-19: Secondary | ICD-10-CM | POA: Diagnosis not present

## 2020-05-29 DIAGNOSIS — F4323 Adjustment disorder with mixed anxiety and depressed mood: Secondary | ICD-10-CM | POA: Diagnosis not present

## 2020-06-09 DIAGNOSIS — Z0000 Encounter for general adult medical examination without abnormal findings: Secondary | ICD-10-CM | POA: Diagnosis not present

## 2020-06-09 DIAGNOSIS — Z13220 Encounter for screening for lipoid disorders: Secondary | ICD-10-CM | POA: Diagnosis not present

## 2020-06-09 DIAGNOSIS — Z01419 Encounter for gynecological examination (general) (routine) without abnormal findings: Secondary | ICD-10-CM | POA: Diagnosis not present

## 2020-06-09 DIAGNOSIS — Z1329 Encounter for screening for other suspected endocrine disorder: Secondary | ICD-10-CM | POA: Diagnosis not present

## 2020-06-09 DIAGNOSIS — Z6836 Body mass index (BMI) 36.0-36.9, adult: Secondary | ICD-10-CM | POA: Diagnosis not present

## 2020-06-09 DIAGNOSIS — Z1231 Encounter for screening mammogram for malignant neoplasm of breast: Secondary | ICD-10-CM | POA: Diagnosis not present

## 2020-06-09 DIAGNOSIS — Z131 Encounter for screening for diabetes mellitus: Secondary | ICD-10-CM | POA: Diagnosis not present

## 2020-08-28 DIAGNOSIS — Z0000 Encounter for general adult medical examination without abnormal findings: Secondary | ICD-10-CM | POA: Diagnosis not present

## 2020-11-26 DIAGNOSIS — D485 Neoplasm of uncertain behavior of skin: Secondary | ICD-10-CM | POA: Diagnosis not present

## 2020-11-26 DIAGNOSIS — L720 Epidermal cyst: Secondary | ICD-10-CM | POA: Diagnosis not present

## 2020-11-26 DIAGNOSIS — D225 Melanocytic nevi of trunk: Secondary | ICD-10-CM | POA: Diagnosis not present

## 2020-11-26 DIAGNOSIS — D2261 Melanocytic nevi of right upper limb, including shoulder: Secondary | ICD-10-CM | POA: Diagnosis not present

## 2020-11-26 DIAGNOSIS — L814 Other melanin hyperpigmentation: Secondary | ICD-10-CM | POA: Diagnosis not present

## 2020-12-12 DIAGNOSIS — Z23 Encounter for immunization: Secondary | ICD-10-CM | POA: Diagnosis not present

## 2020-12-12 DIAGNOSIS — R7309 Other abnormal glucose: Secondary | ICD-10-CM | POA: Diagnosis not present

## 2020-12-12 DIAGNOSIS — E7800 Pure hypercholesterolemia, unspecified: Secondary | ICD-10-CM | POA: Diagnosis not present

## 2021-01-06 DIAGNOSIS — R102 Pelvic and perineal pain: Secondary | ICD-10-CM | POA: Diagnosis not present

## 2021-01-06 DIAGNOSIS — N8111 Cystocele, midline: Secondary | ICD-10-CM | POA: Diagnosis not present

## 2021-04-02 DIAGNOSIS — F411 Generalized anxiety disorder: Secondary | ICD-10-CM | POA: Diagnosis not present

## 2021-04-02 DIAGNOSIS — F900 Attention-deficit hyperactivity disorder, predominantly inattentive type: Secondary | ICD-10-CM | POA: Diagnosis not present

## 2021-06-16 DIAGNOSIS — Z6831 Body mass index (BMI) 31.0-31.9, adult: Secondary | ICD-10-CM | POA: Diagnosis not present

## 2021-06-16 DIAGNOSIS — Z124 Encounter for screening for malignant neoplasm of cervix: Secondary | ICD-10-CM | POA: Diagnosis not present

## 2021-06-16 DIAGNOSIS — Z01419 Encounter for gynecological examination (general) (routine) without abnormal findings: Secondary | ICD-10-CM | POA: Diagnosis not present

## 2021-06-16 DIAGNOSIS — Z1231 Encounter for screening mammogram for malignant neoplasm of breast: Secondary | ICD-10-CM | POA: Diagnosis not present

## 2021-07-03 DIAGNOSIS — E538 Deficiency of other specified B group vitamins: Secondary | ICD-10-CM | POA: Diagnosis not present

## 2021-07-03 DIAGNOSIS — R790 Abnormal level of blood mineral: Secondary | ICD-10-CM | POA: Diagnosis not present

## 2021-07-03 DIAGNOSIS — E785 Hyperlipidemia, unspecified: Secondary | ICD-10-CM | POA: Diagnosis not present

## 2021-07-03 DIAGNOSIS — R5383 Other fatigue: Secondary | ICD-10-CM | POA: Diagnosis not present

## 2021-07-03 DIAGNOSIS — R7989 Other specified abnormal findings of blood chemistry: Secondary | ICD-10-CM | POA: Diagnosis not present

## 2021-07-03 DIAGNOSIS — K5900 Constipation, unspecified: Secondary | ICD-10-CM | POA: Diagnosis not present

## 2021-07-03 DIAGNOSIS — Z1329 Encounter for screening for other suspected endocrine disorder: Secondary | ICD-10-CM | POA: Diagnosis not present

## 2021-07-03 DIAGNOSIS — R6882 Decreased libido: Secondary | ICD-10-CM | POA: Diagnosis not present

## 2021-07-03 DIAGNOSIS — E559 Vitamin D deficiency, unspecified: Secondary | ICD-10-CM | POA: Diagnosis not present

## 2021-07-03 DIAGNOSIS — Z7409 Other reduced mobility: Secondary | ICD-10-CM | POA: Diagnosis not present

## 2021-07-03 DIAGNOSIS — F439 Reaction to severe stress, unspecified: Secondary | ICD-10-CM | POA: Diagnosis not present

## 2021-07-27 DIAGNOSIS — E785 Hyperlipidemia, unspecified: Secondary | ICD-10-CM | POA: Diagnosis not present

## 2021-07-27 DIAGNOSIS — R6882 Decreased libido: Secondary | ICD-10-CM | POA: Diagnosis not present

## 2021-07-27 DIAGNOSIS — F439 Reaction to severe stress, unspecified: Secondary | ICD-10-CM | POA: Diagnosis not present

## 2021-07-27 DIAGNOSIS — R5383 Other fatigue: Secondary | ICD-10-CM | POA: Diagnosis not present

## 2021-07-27 DIAGNOSIS — K5900 Constipation, unspecified: Secondary | ICD-10-CM | POA: Diagnosis not present

## 2021-07-27 DIAGNOSIS — N943 Premenstrual tension syndrome: Secondary | ICD-10-CM | POA: Diagnosis not present

## 2021-07-27 DIAGNOSIS — N926 Irregular menstruation, unspecified: Secondary | ICD-10-CM | POA: Diagnosis not present

## 2021-08-04 DIAGNOSIS — R790 Abnormal level of blood mineral: Secondary | ICD-10-CM | POA: Diagnosis not present

## 2021-08-04 DIAGNOSIS — R5383 Other fatigue: Secondary | ICD-10-CM | POA: Diagnosis not present

## 2021-08-04 DIAGNOSIS — R7989 Other specified abnormal findings of blood chemistry: Secondary | ICD-10-CM | POA: Diagnosis not present

## 2021-08-04 DIAGNOSIS — E538 Deficiency of other specified B group vitamins: Secondary | ICD-10-CM | POA: Diagnosis not present

## 2021-08-10 DIAGNOSIS — R790 Abnormal level of blood mineral: Secondary | ICD-10-CM | POA: Diagnosis not present

## 2021-08-17 DIAGNOSIS — R790 Abnormal level of blood mineral: Secondary | ICD-10-CM | POA: Diagnosis not present

## 2021-08-26 DIAGNOSIS — R790 Abnormal level of blood mineral: Secondary | ICD-10-CM | POA: Diagnosis not present

## 2021-09-03 DIAGNOSIS — Z0000 Encounter for general adult medical examination without abnormal findings: Secondary | ICD-10-CM | POA: Diagnosis not present

## 2021-09-03 DIAGNOSIS — E7800 Pure hypercholesterolemia, unspecified: Secondary | ICD-10-CM | POA: Diagnosis not present

## 2021-10-05 DIAGNOSIS — Z1211 Encounter for screening for malignant neoplasm of colon: Secondary | ICD-10-CM | POA: Diagnosis not present

## 2021-10-05 DIAGNOSIS — Z1212 Encounter for screening for malignant neoplasm of rectum: Secondary | ICD-10-CM | POA: Diagnosis not present

## 2021-11-04 DIAGNOSIS — K5900 Constipation, unspecified: Secondary | ICD-10-CM | POA: Diagnosis not present

## 2021-11-04 DIAGNOSIS — R5383 Other fatigue: Secondary | ICD-10-CM | POA: Diagnosis not present

## 2021-11-04 DIAGNOSIS — R7989 Other specified abnormal findings of blood chemistry: Secondary | ICD-10-CM | POA: Diagnosis not present

## 2021-11-04 DIAGNOSIS — R790 Abnormal level of blood mineral: Secondary | ICD-10-CM | POA: Diagnosis not present

## 2021-11-04 DIAGNOSIS — E538 Deficiency of other specified B group vitamins: Secondary | ICD-10-CM | POA: Diagnosis not present

## 2021-11-16 DIAGNOSIS — R5383 Other fatigue: Secondary | ICD-10-CM | POA: Diagnosis not present

## 2021-11-16 DIAGNOSIS — E538 Deficiency of other specified B group vitamins: Secondary | ICD-10-CM | POA: Diagnosis not present

## 2021-11-16 DIAGNOSIS — R790 Abnormal level of blood mineral: Secondary | ICD-10-CM | POA: Diagnosis not present

## 2021-11-16 DIAGNOSIS — R7989 Other specified abnormal findings of blood chemistry: Secondary | ICD-10-CM | POA: Diagnosis not present

## 2021-11-26 DIAGNOSIS — L814 Other melanin hyperpigmentation: Secondary | ICD-10-CM | POA: Diagnosis not present

## 2021-11-26 DIAGNOSIS — D2261 Melanocytic nevi of right upper limb, including shoulder: Secondary | ICD-10-CM | POA: Diagnosis not present

## 2021-11-26 DIAGNOSIS — D2271 Melanocytic nevi of right lower limb, including hip: Secondary | ICD-10-CM | POA: Diagnosis not present

## 2021-11-26 DIAGNOSIS — D225 Melanocytic nevi of trunk: Secondary | ICD-10-CM | POA: Diagnosis not present

## 2021-12-21 DIAGNOSIS — J019 Acute sinusitis, unspecified: Secondary | ICD-10-CM | POA: Diagnosis not present

## 2021-12-21 DIAGNOSIS — T7840XA Allergy, unspecified, initial encounter: Secondary | ICD-10-CM | POA: Diagnosis not present

## 2021-12-21 DIAGNOSIS — B9689 Other specified bacterial agents as the cause of diseases classified elsewhere: Secondary | ICD-10-CM | POA: Diagnosis not present

## 2021-12-23 DIAGNOSIS — R790 Abnormal level of blood mineral: Secondary | ICD-10-CM | POA: Diagnosis not present

## 2021-12-23 DIAGNOSIS — E039 Hypothyroidism, unspecified: Secondary | ICD-10-CM | POA: Diagnosis not present

## 2021-12-23 DIAGNOSIS — R5383 Other fatigue: Secondary | ICD-10-CM | POA: Diagnosis not present

## 2021-12-23 DIAGNOSIS — E063 Autoimmune thyroiditis: Secondary | ICD-10-CM | POA: Diagnosis not present

## 2022-04-12 DIAGNOSIS — N926 Irregular menstruation, unspecified: Secondary | ICD-10-CM | POA: Diagnosis not present

## 2022-04-16 DIAGNOSIS — Z3202 Encounter for pregnancy test, result negative: Secondary | ICD-10-CM | POA: Diagnosis not present

## 2022-04-16 DIAGNOSIS — N840 Polyp of corpus uteri: Secondary | ICD-10-CM | POA: Diagnosis not present

## 2024-04-30 ENCOUNTER — Other Ambulatory Visit: Payer: Self-pay

## 2024-04-30 ENCOUNTER — Emergency Department (HOSPITAL_COMMUNITY)

## 2024-04-30 ENCOUNTER — Emergency Department (HOSPITAL_COMMUNITY)
Admission: EM | Admit: 2024-04-30 | Discharge: 2024-04-30 | Disposition: A | Discharge status: Home / Self Care | Attending: Emergency Medicine | Admitting: Emergency Medicine

## 2024-04-30 ENCOUNTER — Encounter (HOSPITAL_COMMUNITY): Payer: Self-pay | Admitting: *Deleted

## 2024-04-30 DIAGNOSIS — N132 Hydronephrosis with renal and ureteral calculous obstruction: Secondary | ICD-10-CM | POA: Diagnosis not present

## 2024-04-30 DIAGNOSIS — Z79899 Other long term (current) drug therapy: Secondary | ICD-10-CM | POA: Insufficient documentation

## 2024-04-30 DIAGNOSIS — N202 Calculus of kidney with calculus of ureter: Secondary | ICD-10-CM

## 2024-04-30 DIAGNOSIS — R10A2 Flank pain, left side: Secondary | ICD-10-CM | POA: Diagnosis present

## 2024-04-30 LAB — COMPREHENSIVE METABOLIC PANEL WITH GFR
ALT: 16 U/L (ref 0–44)
AST: 21 U/L (ref 15–41)
Albumin: 3.9 g/dL (ref 3.5–5.0)
Alkaline Phosphatase: 41 U/L (ref 38–126)
Anion gap: 10 (ref 5–15)
BUN: 15 mg/dL (ref 6–20)
CO2: 26 mmol/L (ref 22–32)
Calcium: 9.1 mg/dL (ref 8.9–10.3)
Chloride: 104 mmol/L (ref 98–111)
Creatinine, Ser: 0.91 mg/dL (ref 0.44–1.00)
GFR, Estimated: >60 mL/min (ref >60)
Glucose, Bld: 107 mg/dL — ABNORMAL HIGH (ref 70–99)
Potassium: 4.4 mmol/L (ref 3.5–5.1)
Sodium: 140 mmol/L (ref 135–145)
Total Bilirubin: 0.7 mg/dL (ref 0.0–1.2)
Total Protein: 6.9 g/dL (ref 6.5–8.1)

## 2024-04-30 LAB — CBC
HCT: 39 % (ref 36.0–46.0)
Hemoglobin: 13.2 g/dL (ref 12.0–15.0)
MCH: 30.5 pg (ref 26.0–34.0)
MCHC: 33.8 g/dL (ref 30.0–36.0)
MCV: 90.1 fL (ref 80.0–100.0)
Platelets: 314 K/uL (ref 150–400)
RBC: 4.33 MIL/uL (ref 3.87–5.11)
RDW: 12.2 % (ref 11.5–15.5)
WBC: 8.6 K/uL (ref 4.0–10.5)
nRBC: 0 % (ref 0.0–0.2)

## 2024-04-30 LAB — URINALYSIS, ROUTINE W REFLEX MICROSCOPIC
Bilirubin Urine: NEGATIVE
Glucose, UA: NEGATIVE mg/dL
Ketones, ur: NEGATIVE mg/dL
Leukocytes,Ua: NEGATIVE
Nitrite: NEGATIVE
Protein, ur: NEGATIVE mg/dL
Specific Gravity, Urine: 1.021 (ref 1.005–1.030)
pH: 5 (ref 5.0–8.0)

## 2024-04-30 LAB — LIPASE, BLOOD: Lipase: 42 U/L (ref 11–51)

## 2024-04-30 LAB — HCG, SERUM, QUALITATIVE: Preg, Serum: NEGATIVE

## 2024-04-30 MED ORDER — ONDANSETRON HCL 4 MG PO TABS: 4.0000 mg | ORAL_TABLET | Freq: Four times a day (QID) | ORAL | 0 refills | Status: AC

## 2024-04-30 MED ORDER — SODIUM CHLORIDE 0.9 % IV BOLUS
1000.0000 mL | Freq: Once | INTRAVENOUS | Status: AC
  Administered 2024-04-30: 1000 mL via INTRAVENOUS

## 2024-04-30 MED ORDER — ONDANSETRON HCL 4 MG/2ML IJ SOLN
4.0000 mg | Freq: Once | INTRAMUSCULAR | Status: AC
  Administered 2024-04-30: 4 mg via INTRAVENOUS
  Filled 2024-04-30: qty 2

## 2024-04-30 MED ORDER — ONDANSETRON HCL 4 MG PO TABS: 4.0000 mg | ORAL_TABLET | Freq: Four times a day (QID) | ORAL | 0 refills | Status: DC

## 2024-04-30 MED ORDER — KETOROLAC TROMETHAMINE 15 MG/ML IJ SOLN
15.0000 mg | Freq: Once | INTRAMUSCULAR | Status: AC
  Administered 2024-04-30: 15 mg via INTRAVENOUS
  Filled 2024-04-30: qty 1

## 2024-04-30 MED ORDER — TAMSULOSIN HCL 0.4 MG PO CAPS: 0.4000 mg | ORAL_CAPSULE | Freq: Every day | ORAL | 0 refills | Status: AC

## 2024-04-30 MED ORDER — TAMSULOSIN HCL 0.4 MG PO CAPS: 0.4000 mg | ORAL_CAPSULE | Freq: Every day | ORAL | 0 refills | Status: DC

## 2024-04-30 MED ORDER — HYDROCODONE-ACETAMINOPHEN 5-325 MG PO TABS: 1.0000 | ORAL_TABLET | ORAL | 0 refills | Status: AC | PRN

## 2024-04-30 MED ORDER — HYDROCODONE-ACETAMINOPHEN 5-325 MG PO TABS: 1.0000 | ORAL_TABLET | ORAL | 0 refills | Status: DC | PRN

## 2024-04-30 NOTE — ED Triage Notes (Signed)
 C/o left sided abd. Pain onset yest 10am increased aroung 2pm states she vomited several times yest. Pain subsided and she was able to sleep however when she woke up to go to the bathroom 4am the pain return but not as bad. Denies problems with bowels or bladder

## 2024-04-30 NOTE — ED Notes (Signed)
 Patient transported to CT

## 2024-04-30 NOTE — ED Provider Notes (Signed)
 [place] [facility] AT [facility] Provider Note   CSN: [id number] Arrival date & time: 04/30/24  9386     Patient presents with: Abdominal Pain   Karen Monroe is a 48 y.o. female who presents with left-sided flank pain that began approximately 10 AM yesterday, although she states that it did subside throughout the day, episode was associated with nausea and vomiting.  She did tolerate p.o. intake yesterday, had large relief of pain and was able to go to sleep however at 4 AM this morning had a return of the similar type pain with severe nausea and vomiting.  Denies any dysuria, states that several years ago when undergoing imaging for a gynecologic issue they identified renal calculi and imaging obtained at that time.    Abdominal Pain Associated symptoms: nausea and vomiting        Prior to Admission medications   Medication Sig Start Date End Date Taking? Authorizing Provider  HYDROcodone -acetaminophen  (NORCO/VICODIN) 5-325 MG tablet Take 1-2 tablets by mouth every 4 (four) hours as needed for up to 3 days. 04/30/24 05/03/24 Yes Myriam Dorn BROCKS, PA  ondansetron  (ZOFRAN ) 4 MG tablet Take 1 tablet (4 mg total) by mouth every 6 (six) hours. 04/30/24  Yes Myriam Dorn BROCKS, PA  tamsulosin (FLOMAX) 0.4 MG CAPS capsule Take 1 capsule (0.4 mg total) by mouth daily. Continue to take until passage of kidney stone. 04/30/24 05/30/24 Yes Myriam Dorn C, PA  BELVIQ 10 MG TABS Take 1 tablet by mouth 2 (two) times daily. 03/01/18   [provider]  escitalopram (LEXAPRO) 10 MG tablet Take 10 mg by mouth daily.    [provider]  FIBER SELECT GUMMIES PO Take 2 each by mouth daily as needed (For constipation.).    [provider]  ibuprofen  (ADVIL ,MOTRIN ) 200 MG tablet Take 400 mg by mouth every 6 (six) hours as needed for headache or mild pain.     [provider]  ipratropium (ATROVENT) 0.03 % nasal spray Place 2 sprays into  both nostrils 2 (two) times daily. 03/14/24   [provider]  lisdexamfetamine (VYVANSE) 30 MG capsule Take 30 mg by mouth every morning. 04/10/24   [provider]  meloxicam  (MOBIC ) 15 MG tablet Take 1 tablet (15 mg total) by mouth daily. 04/08/18   Stover, Titorya, DPM  methylPREDNISolone  (MEDROL  DOSEPAK) 4 MG TBPK tablet Take 1st as directed 04/08/18   Stover, Titorya, DPM  WEGOVY 1 MG/0.5ML SOAJ SQ injection Inject 1 mg into the skin once a week. 04/09/24   [provider]    Allergies: Bee venom    Review of Systems  Gastrointestinal:  Positive for abdominal pain, nausea and vomiting.  All other systems reviewed and are negative.   Updated Vital Signs BP (!) 135/98 (BP Location: Right Arm)   Pulse (!) 109   Temp 97.6 F (36.4 C) (Oral)   Resp 14   Ht 5' 2 (1.575 m)   Wt 88.5 kg   SpO2 99%   BMI 35.67 kg/m   Physical Exam Vitals and nursing note reviewed.  Constitutional:      General: She is awake. She is not in acute distress.    Appearance: Normal appearance. She is well-developed.  HENT:     Head: Normocephalic and atraumatic.     Mouth/Throat:     Mouth: Mucous membranes are moist.     Pharynx: Oropharynx is clear.  Eyes:     Extraocular Movements: Extraocular movements intact.  Conjunctiva/sclera: Conjunctivae normal.     Pupils: Pupils are equal, round, and reactive to light.  Cardiovascular:     Rate and Rhythm: Normal rate and regular rhythm.     Pulses: Normal pulses.     Heart sounds: Normal heart sounds. No murmur heard.    No friction rub. No gallop.  Pulmonary:     Effort: Pulmonary effort is normal.     Breath sounds: Normal breath sounds.  Abdominal:     General: Abdomen is flat. Bowel sounds are normal.     Palpations: Abdomen is soft.     Tenderness: There is abdominal tenderness in the left lower quadrant. There is left CVA tenderness.     Hernia: No hernia is present.  Musculoskeletal:        General: Normal  range of motion.     Cervical back: Normal range of motion and neck supple.     Right lower leg: No edema.     Left lower leg: No edema.  Skin:    General: Skin is warm and dry.     Capillary Refill: Capillary refill takes less than 2 seconds.  Neurological:     General: No focal deficit present.     Mental Status: She is alert and oriented to person, place, and time. Mental status is at baseline.     GCS: GCS eye subscore is 4. GCS verbal subscore is 5. GCS motor subscore is 6.  Psychiatric:        Mood and Affect: Mood normal.        Behavior: Behavior is cooperative.     (all labs ordered are listed, but only abnormal results are displayed) Labs Reviewed  COMPREHENSIVE METABOLIC PANEL WITH GFR - Abnormal; Notable for the following components:      Result Value   Glucose, Bld 107 (*)    All other components within normal limits  URINALYSIS, ROUTINE W REFLEX MICROSCOPIC - Abnormal; Notable for the following components:   APPearance HAZY (*)    Hgb urine dipstick SMALL (*)    Bacteria, UA RARE (*)    All other components within normal limits  LIPASE, BLOOD  CBC  HCG, SERUM, QUALITATIVE    EKG: None  Radiology: CT Renal Stone Study Result Date: 04/30/2024 CLINICAL DATA:  Abdominal and flank pain. EXAM: CT ABDOMEN AND PELVIS WITHOUT CONTRAST TECHNIQUE: Multidetector CT imaging of the abdomen and pelvis was performed following the standard protocol without IV contrast. RADIATION DOSE REDUCTION: This exam was performed according to the departmental dose-optimization program which includes automated exposure control, adjustment of the mA and/or kV according to patient size and/or use of iterative reconstruction technique. COMPARISON:  06/15/2015 FINDINGS: Lower chest: Unremarkable. Hepatobiliary: Scattered tiny hypodensities in the liver parenchyma are too small to characterize but are statistically most likely benign. No followup imaging is recommended. There is no evidence for  gallstones, gallbladder wall thickening, or pericholecystic fluid. No intrahepatic or extrahepatic biliary dilation. Pancreas: No focal mass lesion. No dilatation of the main duct. No intraparenchymal cyst. No peripancreatic edema. Spleen: No splenomegaly. No suspicious focal mass lesion. Adrenals/Urinary Tract: No adrenal nodule or mass. Several punctate nonobstructing stones are seen in the right kidney. Right ureter unremarkable. Several punctate nonobstructing stones are seen in the left kidney. Mild left hydronephrosis with mild left perinephric edema. Mild left hydroureter evident with 4 x 4 x 5 mm stone in the left UVJ. Bladder is decompressed. Stomach/Bowel: Stomach is unremarkable. No gastric wall thickening. No evidence of  outlet obstruction. Duodenum is normally positioned as is the ligament of Treitz. No small bowel wall thickening. No small bowel dilatation. The terminal ileum is normal. The appendix is normal. No gross colonic mass. No colonic wall thickening. Vascular/Lymphatic: No abdominal aortic aneurysm. No abdominal aortic atherosclerotic calcification. There is no gastrohepatic or hepatoduodenal ligament lymphadenopathy. No retroperitoneal or mesenteric lymphadenopathy. No pelvic sidewall lymphadenopathy. Reproductive: Unremarkable. Other: No intraperitoneal free fluid. Musculoskeletal: No worrisome lytic or sclerotic osseous abnormality. IMPRESSION: 1. 4 x 4 x 5 mm left UVJ stone with mild left hydroureteronephrosis and mild left perinephric edema. 2. Additional punctate nonobstructing stones in both kidneys. Electronically Signed   By: Camellia Candle M.D.   On: 04/30/2024 08:13     Procedures   Medications Ordered in the ED  ondansetron  (ZOFRAN ) injection 4 mg (4 mg Intravenous Given 04/30/24 0738)  sodium chloride  0.9 % bolus 1,000 mL (0 mLs Intravenous Stopped 04/30/24 0840)  ketorolac  (TORADOL ) 15 MG/ML injection 15 mg (15 mg Intravenous Given 04/30/24 0738)  ketorolac  (TORADOL ) 15  MG/ML injection 15 mg (15 mg Intravenous Given 04/30/24 0837)                                    Medical Decision Making Amount and/or Complexity of Data Reviewed Labs: ordered. Radiology: ordered.  Risk Prescription drug management.   Medical Decision Making:   SHARLENA KRISTENSEN is a 48 y.o. female who presented to the ED today with left-sided flank/abdominal pain detailed above.     Complete initial physical exam performed, notably the patient  was alert and oriented in no apparent distress however she does appear visibly uncomfortable.  Positive CVA tenderness on the left. Reviewed and confirmed nursing documentation for past medical history, family history, social history.    Initial Assessment:   With the patient's presentation of left-sided flank/abdominal pain, most likely diagnosis is nephro/urolithiasis. Other diagnoses were considered including (but not limited to) bowel obstruction, urinary outlet obstruction, mesenteric ischemia, UTI/pyelonephritis. These are considered less likely due to history of present illness and physical exam findings.     Initial Plan:  Obtain CT imaging of the abdomen to evaluate presence of renal calculi Screening labs including CBC and Metabolic panel to evaluate for infectious or metabolic etiology of disease.  Assess serum lipase to evaluate for pancreatic etiology. Assess hCG to determine pregnancy secondary to radial imaging and pharmacotherapy Urinalysis with reflex culture ordered to evaluate for UTI or relevant urologic/nephrologic pathology.  Objective evaluation as below reviewed   Initial Study Results:   Laboratory  All laboratory results reviewed without evidence of clinically relevant pathology.   Exceptions include: None    Radiology:  All images reviewed independently. Agree with radiology report at this time.   CT Renal Stone Study Result Date: 04/30/2024 CLINICAL DATA:  Abdominal and flank pain. EXAM: CT ABDOMEN AND  PELVIS WITHOUT CONTRAST TECHNIQUE: Multidetector CT imaging of the abdomen and pelvis was performed following the standard protocol without IV contrast. RADIATION DOSE REDUCTION: This exam was performed according to the departmental dose-optimization program which includes automated exposure control, adjustment of the mA and/or kV according to patient size and/or use of iterative reconstruction technique. COMPARISON:  06/15/2015 FINDINGS: Lower chest: Unremarkable. Hepatobiliary: Scattered tiny hypodensities in the liver parenchyma are too small to characterize but are statistically most likely benign. No followup imaging is recommended. There is no evidence for gallstones, gallbladder wall thickening, or pericholecystic  fluid. No intrahepatic or extrahepatic biliary dilation. Pancreas: No focal mass lesion. No dilatation of the main duct. No intraparenchymal cyst. No peripancreatic edema. Spleen: No splenomegaly. No suspicious focal mass lesion. Adrenals/Urinary Tract: No adrenal nodule or mass. Several punctate nonobstructing stones are seen in the right kidney. Right ureter unremarkable. Several punctate nonobstructing stones are seen in the left kidney. Mild left hydronephrosis with mild left perinephric edema. Mild left hydroureter evident with 4 x 4 x 5 mm stone in the left UVJ. Bladder is decompressed. Stomach/Bowel: Stomach is unremarkable. No gastric wall thickening. No evidence of outlet obstruction. Duodenum is normally positioned as is the ligament of Treitz. No small bowel wall thickening. No small bowel dilatation. The terminal ileum is normal. The appendix is normal. No gross colonic mass. No colonic wall thickening. Vascular/Lymphatic: No abdominal aortic aneurysm. No abdominal aortic atherosclerotic calcification. There is no gastrohepatic or hepatoduodenal ligament lymphadenopathy. No retroperitoneal or mesenteric lymphadenopathy. No pelvic sidewall lymphadenopathy. Reproductive: Unremarkable.  Other: No intraperitoneal free fluid. Musculoskeletal: No worrisome lytic or sclerotic osseous abnormality. IMPRESSION: 1. 4 x 4 x 5 mm left UVJ stone with mild left hydroureteronephrosis and mild left perinephric edema. 2. Additional punctate nonobstructing stones in both kidneys. Electronically Signed   By: Camellia Candle M.D.   On: 04/30/2024 08:13    Reassessment and Plan:   Imaging does show a 4 x 4 x 5 mm left stone in the ureterovesicular junction.  Also noted mild left hydroureternephrosis with perinephric edema.  Given the size of the stone, and improvement of patient's symptoms with IV ketorolac , will prescribe a course of tamsulosin and also prescribed a course of ondansetron  for nausea management, prescribed short course of hydrocodone  for pain management above and beyond ibuprofen .  Refer to urology for outpatient follow-up given the presence of small nonobstructing stones in the bilateral kidneys.  Careful return precautions have been given to the patient, she understands and agrees has no further concerns at this time.  As her vital signs are stable, and there is no concerning findings on imaging, plan at this time is to discharge with outpatient follow-up to urology.       Final diagnoses:  Calculus of kidney with calculus of ureter  Hydronephrosis with urinary obstruction due to ureteral calculus    ED Discharge Orders          Ordered    tamsulosin (FLOMAX) 0.4 MG CAPS capsule  Daily        04/30/24 0824    ondansetron  (ZOFRAN ) 4 MG tablet  Every 6 hours        04/30/24 0824    HYDROcodone -acetaminophen  (NORCO/VICODIN) 5-325 MG tablet  Every 4 hours PRN        04/30/24 0846               Myriam Dorn BROCKS, PA 04/30/24 9153    Francesca Elsie CROME, MD 05/01/24 [phone]

## 2024-04-30 NOTE — ED Notes (Signed)
 Assuming pt care, pt walk in for RUQ, N/V pain x 2 days, med hx. Anxiety, gyn sx. pt aaox4, mae, skin warm/dry. Laying in bed, call bell within reach
